# Patient Record
Sex: Female | Born: 1990 | Race: Black or African American | Hispanic: No | Smoking: Current every day smoker
Health system: Southern US, Community
[De-identification: ages and names within clinical notes are randomized; demographics above are authoritative.]

## PROBLEM LIST (undated history)

## (undated) DIAGNOSIS — J45909 Unspecified asthma, uncomplicated: Secondary | ICD-10-CM

## (undated) HISTORY — PX: CHOLECYSTECTOMY: SHX55

---

## 2020-03-01 ENCOUNTER — Emergency Department (HOSPITAL_COMMUNITY)
Admission: EM | Admit: 2020-03-01 | Discharge: 2020-03-02 | Disposition: A | Discharge status: Home / Self Care | Payer: Medicaid - Out of State | Attending: Emergency Medicine | Admitting: Emergency Medicine

## 2020-03-01 ENCOUNTER — Other Ambulatory Visit: Payer: Self-pay

## 2020-03-01 ENCOUNTER — Emergency Department (HOSPITAL_COMMUNITY): Payer: Medicaid - Out of State

## 2020-03-01 DIAGNOSIS — J069 Acute upper respiratory infection, unspecified: Secondary | ICD-10-CM | POA: Insufficient documentation

## 2020-03-01 DIAGNOSIS — J45909 Unspecified asthma, uncomplicated: Secondary | ICD-10-CM | POA: Insufficient documentation

## 2020-03-01 DIAGNOSIS — F1590 Other stimulant use, unspecified, uncomplicated: Secondary | ICD-10-CM | POA: Diagnosis not present

## 2020-03-01 DIAGNOSIS — Z20822 Contact with and (suspected) exposure to covid-19: Secondary | ICD-10-CM | POA: Insufficient documentation

## 2020-03-01 DIAGNOSIS — J988 Other specified respiratory disorders: Secondary | ICD-10-CM

## 2020-03-01 DIAGNOSIS — R0602 Shortness of breath: Secondary | ICD-10-CM | POA: Diagnosis present

## 2020-03-01 LAB — COMPREHENSIVE METABOLIC PANEL
ALT: 10 U/L (ref 0–44)
AST: 17 U/L (ref 15–41)
Albumin: 3.7 g/dL (ref 3.5–5.0)
Alkaline Phosphatase: 51 U/L (ref 38–126)
Anion gap: 10 (ref 5–15)
BUN: 8 mg/dL (ref 6–20)
CO2: 25 mmol/L (ref 22–32)
Calcium: 9.4 mg/dL (ref 8.9–10.3)
Chloride: 104 mmol/L (ref 98–111)
Creatinine, Ser: 0.98 mg/dL (ref 0.44–1.00)
GFR calc Af Amer: >60 mL/min (ref >60)
GFR calc non Af Amer: >60 mL/min (ref >60)
Glucose, Bld: 70 mg/dL (ref 70–99)
Potassium: 3.9 mmol/L (ref 3.5–5.1)
Sodium: 139 mmol/L (ref 135–145)
Total Bilirubin: 0.3 mg/dL (ref 0.3–1.2)
Total Protein: 7.5 g/dL (ref 6.5–8.1)

## 2020-03-01 LAB — CBC WITH DIFFERENTIAL/PLATELET
Abs Immature Granulocytes: 0.02 10*3/uL (ref 0.00–0.07)
Basophils Absolute: 0.1 10*3/uL (ref 0.0–0.1)
Basophils Relative: 1 %
Eosinophils Absolute: 0.2 10*3/uL (ref 0.0–0.5)
Eosinophils Relative: 3 %
HCT: 38.7 % (ref 36.0–46.0)
Hemoglobin: 11.6 g/dL — ABNORMAL LOW (ref 12.0–15.0)
Immature Granulocytes: 0 %
Lymphocytes Relative: 25 %
Lymphs Abs: 2 10*3/uL (ref 0.7–4.0)
MCH: 25.3 pg — ABNORMAL LOW (ref 26.0–34.0)
MCHC: 30 g/dL (ref 30.0–36.0)
MCV: 84.3 fL (ref 80.0–100.0)
Monocytes Absolute: 0.6 10*3/uL (ref 0.1–1.0)
Monocytes Relative: 7 %
Neutro Abs: 5.1 10*3/uL (ref 1.7–7.7)
Neutrophils Relative %: 64 %
Platelets: 393 10*3/uL (ref 150–400)
RBC: 4.59 MIL/uL (ref 3.87–5.11)
RDW: 14.4 % (ref 11.5–15.5)
WBC: 8 10*3/uL (ref 4.0–10.5)
nRBC: 0 % (ref 0.0–0.2)

## 2020-03-01 MED ORDER — SODIUM CHLORIDE 0.9% FLUSH
3.0000 mL | Freq: Once | INTRAVENOUS | Status: AC
  Administered 2020-03-02: 3 mL via INTRAVENOUS

## 2020-03-01 MED ORDER — ACETAMINOPHEN 325 MG PO TABS
650.0000 mg | ORAL_TABLET | Freq: Once | ORAL | Status: AC | PRN
  Administered 2020-03-01: 650 mg via ORAL
  Filled 2020-03-01: qty 2

## 2020-03-01 NOTE — ED Notes (Signed)
Pt sitting over by vending machines.

## 2020-03-01 NOTE — ED Triage Notes (Addendum)
Pt arrives pov with reports of chest tightness and shob onset yesterday. Reports hx of asthma and is in town visiting, does not have her inhaler with her. Pt in NAD. Speaking in complete sentences. Pt febrile in triage. States she was unaware of the fever.

## 2020-03-02 ENCOUNTER — Telehealth: Payer: Self-pay

## 2020-03-02 LAB — I-STAT BETA HCG BLOOD, ED (MC, WL, AP ONLY): I-stat hCG, quantitative: <5 m[IU]/mL (ref <5)

## 2020-03-02 LAB — SARS CORONAVIRUS 2 BY RT PCR (HOSPITAL ORDER, PERFORMED IN ~~LOC~~ HOSPITAL LAB): SARS Coronavirus 2: NEGATIVE

## 2020-03-02 LAB — LACTIC ACID, PLASMA: Lactic Acid, Venous: 1.6 mmol/L (ref 0.5–1.9)

## 2020-03-02 MED ORDER — BENZONATATE 100 MG PO CAPS
100.0000 mg | ORAL_CAPSULE | Freq: Three times a day (TID) | ORAL | 0 refills | Status: DC | PRN
Start: 2020-03-02 — End: 2022-12-16

## 2020-03-02 MED ORDER — DOXYCYCLINE HYCLATE 100 MG PO CAPS
100.0000 mg | ORAL_CAPSULE | Freq: Two times a day (BID) | ORAL | 0 refills | Status: DC
Start: 2020-03-02 — End: 2022-12-16

## 2020-03-02 MED ORDER — ALBUTEROL (5 MG/ML) CONTINUOUS INHALATION SOLN
10.0000 mg/h | INHALATION_SOLUTION | Freq: Once | RESPIRATORY_TRACT | Status: AC
  Administered 2020-03-02: 10 mg/h via RESPIRATORY_TRACT
  Filled 2020-03-02: qty 20

## 2020-03-02 MED ORDER — PREDNISONE 20 MG PO TABS
60.0000 mg | ORAL_TABLET | Freq: Once | ORAL | Status: AC
  Administered 2020-03-02: 60 mg via ORAL
  Filled 2020-03-02: qty 3

## 2020-03-02 MED ORDER — ALBUTEROL SULFATE HFA 108 (90 BASE) MCG/ACT IN AERS: 1.0000 | INHALATION_SPRAY | Freq: Four times a day (QID) | RESPIRATORY_TRACT | 0 refills | Status: DC | PRN

## 2020-03-02 MED ORDER — ALBUTEROL SULFATE HFA 108 (90 BASE) MCG/ACT IN AERS
8.0000 | INHALATION_SPRAY | Freq: Once | RESPIRATORY_TRACT | Status: AC
  Administered 2020-03-02: 8 via RESPIRATORY_TRACT
  Filled 2020-03-02: qty 6.7

## 2020-03-02 MED ORDER — IPRATROPIUM BROMIDE HFA 17 MCG/ACT IN AERS
4.0000 | INHALATION_SPRAY | Freq: Once | RESPIRATORY_TRACT | Status: AC
  Administered 2020-03-02: 4 via RESPIRATORY_TRACT
  Filled 2020-03-02: qty 12.9

## 2020-03-02 MED ORDER — AEROCHAMBER PLUS FLO-VU LARGE MISC: 1.0000 | Freq: Once | Status: DC

## 2020-03-02 MED ORDER — IBUPROFEN 400 MG PO TABS
400.0000 mg | ORAL_TABLET | Freq: Once | ORAL | Status: AC
  Administered 2020-03-02: 400 mg via ORAL
  Filled 2020-03-02: qty 1

## 2020-03-02 MED ORDER — PREDNISONE 10 MG PO TABS: 40.0000 mg | ORAL_TABLET | Freq: Every day | ORAL | 0 refills | Status: AC

## 2020-03-02 NOTE — Telephone Encounter (Signed)
Patient at pharmacy, her medications ordered escribe did not go through to [facility]. Called in to [facility] by this RNCM

## 2020-03-02 NOTE — ED Provider Notes (Signed)
[facility] EMERGENCY DEPARTMENT Provider Note   CSN: [id number] Arrival date & time: 03/01/20  1735     History Chief Complaint  Patient presents with   Shortness of Breath    Colleen Lewis is a 29 y.o. female with history of childhood asthma, marijuana use presents to the ED for evaluation of chest tightness and shortness of breath that began yesterday.  Associated with wheezing.  States while in the waiting room has developed runny nose, cough with clear phlegm, body aches, generalized malaise.  Fever 101 in triage. Patient flew from [place] to [place] 3 weeks ago.  Had first Pfizer vaccine 1 week ago.  Denies any sick contacts.  Has not needed to use inhalers for at least 2 years.  Denies sore throat, pleuritic chest pain, vomiting, diarrhea, abdominal pain, loss of taste or smell.  No history of blood clots, recent surgery, hormone therapy, prolonged immobilization, leg swelling, calf pain. No interventions. No modifying factors.   HPI     No past medical history on file.  There are no problems to display for this patient.   ** The histories are not reviewed yet. Please review them in the "History" navigator section and refresh this SmartLink.   OB History   No obstetric history on file.     No family history on file.  Social History   Tobacco Use   Smoking status: Not on file  Substance Use Topics   Alcohol use: Not on file   Drug use: Not on file    Home Medications Prior to Admission medications   Medication Sig Start Date End Date Taking? Authorizing Provider  albuterol (VENTOLIN HFA) 108 (90 Base) MCG/ACT inhaler Inhale 1-2 puffs into the lungs every 6 (six) hours as needed for wheezing or shortness of breath. 03/02/20   Liberty Handy, PA-C  benzonatate (TESSALON PERLES) 100 MG capsule Take 1 capsule (100 mg total) by mouth 3 (three) times daily as needed for cough. FOR DISRUPTIVE DAY TIME COUGH 03/02/20   Liberty Handy,  PA-C  doxycycline (VIBRAMYCIN) 100 MG capsule Take 1 capsule (100 mg total) by mouth 2 (two) times daily. 03/02/20   Liberty Handy, PA-C  predniSONE (DELTASONE) 10 MG tablet Take 4 tablets (40 mg total) by mouth daily for 5 days. 03/02/20 03/07/20  Liberty Handy, PA-C    Allergies    Benadryl [diphenhydramine]  Review of Systems   Review of Systems  Constitutional: Positive for chills, fatigue and fever.  HENT: Positive for congestion and rhinorrhea.   Respiratory: Positive for cough, chest tightness, shortness of breath and wheezing.   Musculoskeletal: Positive for myalgias.  All other systems reviewed and are negative.   Physical Exam Updated Vital Signs BP (!) 116/89    Pulse (!) 118    Temp (!) 101 F (38.3 C) (Oral)    Resp 23    Ht 5\' 5"  (1.651 m)    Wt (!) 95.3 kg    SpO2 99%    BMI 34.95 kg/m   Physical Exam Vitals and nursing note reviewed.  Constitutional:      General: She is not in acute distress.    Appearance: She is well-developed.     Comments: NAD.  HENT:     Head: Normocephalic and atraumatic.     Right Ear: External ear normal.     Left Ear: External ear normal.     Nose: Nose normal.  Eyes:     General: No scleral  icterus.    Conjunctiva/sclera: Conjunctivae normal.  Cardiovascular:     Rate and Rhythm: Normal rate and regular rhythm.     Heart sounds: Normal heart sounds. No murmur heard.      Comments: No LE edema. No calf tenderness.  Pulmonary:     Effort: Pulmonary effort is normal.     Breath sounds: Examination of the right-upper field reveals wheezing. Examination of the left-upper field reveals wheezing. Examination of the right-middle field reveals wheezing. Examination of the left-middle field reveals wheezing. Examination of the right-lower field reveals decreased breath sounds. Examination of the left-lower field reveals decreased breath sounds. Decreased breath sounds (difficult exam due to body habitus) and wheezing present.    Musculoskeletal:        General: No deformity. Normal range of motion.     Cervical back: Normal range of motion and neck supple.  Skin:    General: Skin is warm and dry.     Capillary Refill: Capillary refill takes less than 2 seconds.  Neurological:     Mental Status: She is alert and oriented to person, place, and time.  Psychiatric:        Behavior: Behavior normal.        Thought Content: Thought content normal.        Judgment: Judgment normal.     ED Results / Procedures / Treatments   Labs (all labs ordered are listed, but only abnormal results are displayed) Labs Reviewed  CBC WITH DIFFERENTIAL/PLATELET - Abnormal; Notable for the following components:      Result Value   Hemoglobin 11.6 (*)    MCH 25.3 (*)    All other components within normal limits  SARS CORONAVIRUS 2 BY RT PCR (HOSPITAL ORDER, PERFORMED IN [facility])  COMPREHENSIVE METABOLIC PANEL  LACTIC ACID, PLASMA  I-STAT BETA HCG BLOOD, ED ([facility], [facility], [facility] ONLY)  I-STAT BETA HCG BLOOD, ED ([facility], [facility], [facility] ONLY)    EKG EKG Interpretation  Date/Time:  Friday March 01 2020 17:48:33 EDT Ventricular Rate:  99 PR Interval:  148 QRS Duration: 76 QT Interval:  334 QTC Calculation: 428 R Axis:   59 Text Interpretation: Normal sinus rhythm Nonspecific ST abnormality Abnormal ECG No previous ECGs available Confirmed by Zadie Rhine ([id number]) on 03/02/2020 3:22:11 AM   Radiology DG Chest 2 View  Result Date: 03/01/2020 CLINICAL DATA:  Shortness of breath EXAM: CHEST - 2 VIEW COMPARISON:  None FINDINGS: No consolidation, features of edema, pneumothorax, or effusion. Pulmonary vascularity is normally distributed. The cardiomediastinal contours are unremarkable. No acute osseous or soft tissue abnormality. Cholecystectomy clips in the right upper quadrant. IMPRESSION: No acute cardiopulmonary abnormality. Electronically Signed   By: Kreg Shropshire M.D.   On: 03/01/2020 18:20    Procedures Procedures  (including critical care time)  Medications Ordered in ED Medications  AeroChamber Plus Flo-Vu Large MISC 1 each (1 each Other Not Given 03/02/20 0351)  sodium chloride flush (NS) 0.9 % injection 3 mL (3 mLs Intravenous Given 03/02/20 0211)  acetaminophen (TYLENOL) tablet 650 mg (650 mg Oral Given 03/01/20 1758)  albuterol (VENTOLIN HFA) 108 (90 Base) MCG/ACT inhaler 8 puff (8 puffs Inhalation Given 03/02/20 0229)  ipratropium (ATROVENT HFA) inhaler 4 puff (4 puffs Inhalation Given 03/02/20 0229)  predniSONE (DELTASONE) tablet 60 mg (60 mg Oral Given 03/02/20 0229)  ibuprofen (ADVIL) tablet 400 mg (400 mg Oral Given 03/02/20 0228)  albuterol (PROVENTIL,VENTOLIN) solution continuous neb (10 mg/hr Nebulization Given 03/02/20 0357)  ED Course  I have reviewed the triage vital signs and the nursing notes.  Pertinent labs & imaging results that were available during my care of the patient were reviewed by me and considered in my medical decision making (see chart for details).  Clinical Course as of Mar 02 428  Sat Mar 02, 2020  0206 Pulse Rate(!): 110 [CG]  0206 Temp(!): 101 F (38.3 C) [CG]  0343 Discussed work up findings with patient including negative covid test. States she feels better but still coughing. Will do longer breathing treatment, reassess. Anticipate discharge with antibiotics, prednisone, albuterol.    [CG]    Clinical Course User Index [CG] Liberty Handy, PA-C   MDM Rules/Calculators/A&P                          This patient complaints of URI symptoms. Found to be febrile here.  Had first dose of Pfizer vaccine one week ago. Flew from [place] 3 weeks ago. No sick contacts.  History of asthma, marijuana and tobacco use.   Ddx includes viral URI, CAP.  COVID illness considered but less likely given first vaccine 1 week ago, no exposure, no recent travel.  Labs ordered in triage personally reviewed and interpreted. Available medical records, triage and nursing notes  reviewed to assist with MDM and obtaining more history.   Patient febrile and tachycardic however overall well appearing.  Normal BP, no tachypnea. No significant increased WOB, hypoxia. Ambulated here and reported dyspnea but maintained normal oxygenation.  Technically meets SIRS criteria but clinical well appearing, non toxic.  Source likely respiratory.  However, no leukocytosis, normal  BP. Lactic acid normal. CXR without acute findings.  COVID negative.    Highest on ddx is viral URI other than COVID.  Possibly radiographic delayed CAP.    Ordered prednisone, motrin, atrovent, albuterol. Will reassess.   Patient re-evaluated and feels better. Wheezing improved. Vitals stable. Some tachycardia likely from long breathing albuterol treatment.  Feels better.   Given history of tobacco use, will discharge with doxycycline, prednisone, albuterol inhaler, tessalon perles.  Discussed plan with patient who is in agreement with plan. Return precautions given.  Final Clinical Impression(s) / ED Diagnoses Final diagnoses:  Respiratory infection    Rx / DC Orders ED Discharge Orders         Ordered    doxycycline (VIBRAMYCIN) 100 MG capsule  2 times daily     Discontinue  Reprint     03/02/20 0429    albuterol (VENTOLIN HFA) 108 (90 Base) MCG/ACT inhaler  Every 6 hours PRN     Discontinue  Reprint     03/02/20 0429    predniSONE (DELTASONE) 10 MG tablet  Daily     Discontinue  Reprint     03/02/20 0429    benzonatate (TESSALON PERLES) 100 MG capsule  3 times daily PRN     Discontinue  Reprint     03/02/20 0429           Liberty Handy, PA-C 03/02/20 0430    Zadie Rhine, MD 03/02/20 [phone]

## 2020-03-02 NOTE — ED Notes (Signed)
Pt ambulated for 3 minutes within room, sats at 93%-96%, witnessed wheezing sounds and pt stated she felt very winded

## 2020-03-02 NOTE — Discharge Instructions (Signed)
You were seen in the ER for upper respiratory symptoms, fever  COVID test is negative.  Please plan on obtaining your second vaccine as scheduled.  We discussed possibility of pneumonia despite normal chest x-ray.   We will treat your symptoms with antibiotic (doxycycline), prednisone and breathing treatment.   Symptoms and fever should improve in 48 hours after antibiotics.   Return immediately for worsening or new symptoms

## 2021-01-14 ENCOUNTER — Encounter (HOSPITAL_COMMUNITY): Payer: Self-pay | Admitting: Emergency Medicine

## 2021-01-14 ENCOUNTER — Ambulatory Visit (HOSPITAL_COMMUNITY)
Admission: EM | Admit: 2021-01-14 | Discharge: 2021-01-14 | Disposition: A | Discharge status: Home / Self Care | Payer: Managed Care, Other (non HMO) | Attending: Urgent Care | Admitting: Urgent Care

## 2021-01-14 ENCOUNTER — Other Ambulatory Visit: Payer: Self-pay

## 2021-01-14 DIAGNOSIS — L259 Unspecified contact dermatitis, unspecified cause: Secondary | ICD-10-CM

## 2021-01-14 MED ORDER — TRIAMCINOLONE ACETONIDE 0.1 % EX CREA
1.0000 | TOPICAL_CREAM | Freq: Two times a day (BID) | CUTANEOUS | 0 refills | Status: DC
Start: 2021-01-14 — End: 2024-01-23

## 2021-01-14 NOTE — ED Provider Notes (Signed)
  [facility]   MRN: [id number] DOB: 1991/04/01  Subjective:   Colleen Lewis is a 30 y.o. female presenting for evaluation following a tick bite. Had this to the back of her left knee. Her friend removed the tick yesterday. Denies fever, drainage of pus or bleeding, redness, headache, eye pain, sore throat, cough, joint pains, body aches.  Has not used any medications for relief.  Denies taking chronic medications.    Allergies  Allergen Reactions  . Benadryl [Diphenhydramine] Hives    History reviewed. No pertinent past medical history.   Past Surgical History:  Procedure Laterality Date  . CHOLECYSTECTOMY      Family History  Problem Relation Age of Onset  . Diabetes Mother   . Diabetes Father     Social History   Tobacco Use  . Smoking status: Never Smoker  . Smokeless tobacco: Never Used  Vaping Use  . Vaping Use: Every day  Substance Use Topics  . Alcohol use: Yes  . Drug use: Yes    Types: Marijuana    ROS   Objective:   Vitals: BP (!) 135/98 (BP Location: Right Arm) Comment (BP Location): repositioned patient  Pulse 75   Temp 98.7 F (37.1 C) (Oral)   Resp 18   LMP 12/28/2020   SpO2 100%   Physical Exam Constitutional:      General: She is not in acute distress.    Appearance: Normal appearance. She is well-developed. She is not ill-appearing, toxic-appearing or diaphoretic.  HENT:     Head: Normocephalic and atraumatic.     Nose: Nose normal.     Mouth/Throat:     Mouth: Mucous membranes are moist.     Pharynx: Oropharynx is clear.  Eyes:     General: No scleral icterus.       Right eye: No discharge.        Left eye: No discharge.     Extraocular Movements: Extraocular movements intact.     Conjunctiva/sclera: Conjunctivae normal.     Pupils: Pupils are equal, round, and reactive to light.  Cardiovascular:     Rate and Rhythm: Normal rate.  Pulmonary:     Effort: Pulmonary effort is normal.  Skin:    General:  Skin is warm and dry.       Neurological:     General: No focal deficit present.     Mental Status: She is alert and oriented to person, place, and time.  Psychiatric:        Mood and Affect: Mood normal.        Behavior: Behavior normal.        Thought Content: Thought content normal.        Judgment: Judgment normal.      Assessment and Plan :   PDMP not reviewed this encounter.  1. Contact dermatitis, unspecified contact dermatitis type, unspecified trigger     Recommended conservative management with Kenalog as needed for up to 1 week. No signs of tick borne illness otherwise. Counseled patient on potential for adverse effects with medications prescribed/recommended today, ER and return-to-clinic precautions discussed, patient verbalized understanding.    Wallis Bamberg, PA-C 01/14/21 1436

## 2021-01-14 NOTE — ED Triage Notes (Signed)
Tick removed last night.  Tick located on the back of right knee

## 2021-02-20 ENCOUNTER — Other Ambulatory Visit: Payer: Self-pay

## 2021-02-20 ENCOUNTER — Encounter (HOSPITAL_COMMUNITY): Payer: Self-pay | Admitting: Emergency Medicine

## 2021-02-20 ENCOUNTER — Ambulatory Visit (HOSPITAL_COMMUNITY)
Admission: EM | Admit: 2021-02-20 | Discharge: 2021-02-20 | Disposition: A | Discharge status: Home / Self Care | Payer: Self-pay | Attending: Internal Medicine | Admitting: Internal Medicine

## 2021-02-20 DIAGNOSIS — Z20822 Contact with and (suspected) exposure to covid-19: Secondary | ICD-10-CM | POA: Insufficient documentation

## 2021-02-20 NOTE — ED Triage Notes (Signed)
Covid exposure, testing requested

## 2021-02-20 NOTE — Discharge Instructions (Signed)

## 2021-02-21 LAB — SARS CORONAVIRUS 2 (TAT 6-24 HRS): SARS Coronavirus 2: NEGATIVE

## 2021-12-11 ENCOUNTER — Ambulatory Visit (HOSPITAL_COMMUNITY)
Admission: EM | Admit: 2021-12-11 | Discharge: 2021-12-11 | Disposition: A | Discharge status: Home / Self Care | Payer: Self-pay | Attending: Internal Medicine | Admitting: Internal Medicine

## 2021-12-11 ENCOUNTER — Encounter (HOSPITAL_COMMUNITY): Payer: Self-pay

## 2021-12-11 DIAGNOSIS — J301 Allergic rhinitis due to pollen: Secondary | ICD-10-CM

## 2021-12-11 DIAGNOSIS — Z760 Encounter for issue of repeat prescription: Secondary | ICD-10-CM

## 2021-12-11 DIAGNOSIS — J4520 Mild intermittent asthma, uncomplicated: Secondary | ICD-10-CM

## 2021-12-11 MED ORDER — MONTELUKAST SODIUM 10 MG PO TABS: 10.0000 mg | ORAL_TABLET | Freq: Every day | ORAL | 2 refills | Status: DC

## 2021-12-11 MED ORDER — MONTELUKAST SODIUM 10 MG PO TABS: 10.0000 mg | ORAL_TABLET | Freq: Every day | ORAL | 1 refills | Status: DC

## 2021-12-11 MED ORDER — ALBUTEROL SULFATE HFA 108 (90 BASE) MCG/ACT IN AERS: 1.0000 | INHALATION_SPRAY | Freq: Four times a day (QID) | RESPIRATORY_TRACT | 1 refills | Status: DC | PRN

## 2021-12-11 MED ORDER — FLUTICASONE PROPIONATE 50 MCG/ACT NA SUSP: 1.0000 | Freq: Every day | NASAL | 2 refills | Status: DC

## 2021-12-11 NOTE — ED Provider Notes (Signed)
?[facility] ? ? ? ?CSN: [id number] ?Arrival date & time: 12/11/21  1414 ? ? ?  ? ?History   ?Chief Complaint ?Chief Complaint  ?Patient presents with  ? Medication Refill  ? ? ?HPI ?Colleen Lewis is a 31 y.o. female.  ? ?Patient presents to urgent care for refill of her rescue inhaler. She has been using albuterol every other day during the winter months for asthma flare ups. She does not have a primary care provider. She has had to use her albuterol inhaler a lot over the winter and she ran out of the medication. She states that this happens every winter due to the "cold air" and states that her breathing gets worse with activity and when she is around dogs and cats.  She was last seen for her asthma 2 years ago in the summer where she was prescribed the albuterol inhaler. For the last month, she has been using her albuterol rescue inhaler every other day.  She has a history of hospitalization for an asthma exacerbation 4 years ago.  She does not take any daily medication for allergies at this time.  She denies fever, nausea, vomiting, dizziness, fatigue, shortness of breath, chest pain, and wheezing at this time.  She does report slight nasal drainage, but attributes this to her allergies.  Denies any other aggravating or relieving factors for symptoms at this time. ? ? ?Medication Refill ? ?History reviewed. No pertinent past medical history. ? ?There are no problems to display for this patient. ? ? ?Past Surgical History:  ?Procedure Laterality Date  ? CHOLECYSTECTOMY    ? ? ?OB History   ?No obstetric history on file. ?  ? ? ? ?Home Medications   ? ?Prior to Admission medications   ?Medication Sig Start Date End Date Taking? Authorizing Provider  ?albuterol (VENTOLIN HFA) 108 (90 Base) MCG/ACT inhaler Inhale 1-2 puffs into the lungs every 6 (six) hours as needed for wheezing or shortness of breath. 12/11/21  Yes Talbot Grumbling, FNP  ?fluticasone (FLONASE) 50 MCG/ACT nasal spray Place 1 spray  into both nostrils daily. 12/11/21  Yes Talbot Grumbling, FNP  ?benzonatate (TESSALON PERLES) 100 MG capsule Take 1 capsule (100 mg total) by mouth 3 (three) times daily as needed for cough. FOR DISRUPTIVE DAY TIME COUGH ?Patient not taking: Reported on 01/14/2021 03/02/20   Kinnie Feil, PA-C  ?doxycycline (VIBRAMYCIN) 100 MG capsule Take 1 capsule (100 mg total) by mouth 2 (two) times daily. ?Patient not taking: Reported on 01/14/2021 03/02/20   Kinnie Feil, PA-C  ?montelukast (SINGULAIR) 10 MG tablet Take 1 tablet (10 mg total) by mouth at bedtime. 12/11/21   Talbot Grumbling, FNP  ?triamcinolone cream (KENALOG) 0.1 % Apply 1 application topically 2 (two) times daily. 01/14/21   Jaynee Eagles, PA-C  ? ? ?Family History ?Family History  ?Problem Relation Age of Onset  ? Diabetes Mother   ? Diabetes Father   ? ? ?Social History ?Social History  ? ?Tobacco Use  ? Smoking status: Never  ? Smokeless tobacco: Never  ?Vaping Use  ? Vaping Use: Every day  ?Substance Use Topics  ? Alcohol use: Yes  ? Drug use: Yes  ?  Types: Marijuana  ? ? ? ?Allergies   ?Benadryl [diphenhydramine] ? ? ?Review of Systems ?Review of Systems ?Per HPI ? ?Physical Exam ?Triage Vital Signs ?ED Triage Vitals  ?Enc Vitals Group  ?   BP 12/11/21 1449 (!) 144/107  ?  Pulse Rate 12/11/21 1449 73  ?   Resp 12/11/21 1449 18  ?   Temp 12/11/21 1449 98.1 ?F (36.7 ?C)  ?   Temp Source 12/11/21 1449 Oral  ?   SpO2 12/11/21 1449 98 %  ?   Weight --   ?   Height --   ?   Head Circumference --   ?   Peak Flow --   ?   Pain Score 12/11/21 1450 0  ?   Pain Loc --   ?   Pain Edu? --   ?   Excl. in [place]? --   ? ?No data found. ? ?Updated Vital Signs ?BP (!) 144/107 (BP Location: Left Arm)   Pulse 73   Temp 98.1 ?F (36.7 ?C) (Oral)   Resp 18   LMP 12/01/2021 (Approximate)   SpO2 98%  ? ?Visual Acuity ?Right Eye Distance:   ?Left Eye Distance:   ?Bilateral Distance:   ? ?Right Eye Near:   ?Left Eye Near:    ?Bilateral Near:    ? ?Physical  Exam ?Vitals and nursing note reviewed.  ?Constitutional:   ?   General: She is not in acute distress. ?   Appearance: Normal appearance. She is well-developed. She is not ill-appearing.  ?HENT:  ?   Head: Normocephalic and atraumatic.  ?   Right Ear: Tympanic membrane, ear canal and external ear normal.  ?   Left Ear: Tympanic membrane, ear canal and external ear normal.  ?   Nose: Congestion and rhinorrhea present.  ?   Right Turbinates: Swollen and pale.  ?   Left Turbinates: Swollen and pale.  ?   Mouth/Throat:  ?   Lips: Pink.  ?   Mouth: Mucous membranes are moist. No oral lesions or angioedema.  ?   Dentition: Normal dentition.  ?   Tongue: No lesions. Tongue does not deviate from midline.  ?   Palate: No mass and lesions.  ?   Pharynx: Oropharynx is clear. Uvula midline. Posterior oropharyngeal erythema present. No oropharyngeal exudate.  ?   Tonsils: No tonsillar exudate or tonsillar abscesses. 2+ on the right. 2+ on the left.  ?   Comments: Mild erythema and clear postnasal drainage to posterior oropharynx.  +2 tonsillar swelling present bilaterally to physical exam.  Airway is patent.  No tonsillar abscesses or exudate appreciated.  ?Eyes:  ?   Extraocular Movements: Extraocular movements intact.  ?   Conjunctiva/sclera: Conjunctivae normal.  ?Cardiovascular:  ?   Rate and Rhythm: Normal rate and regular rhythm.  ?   Heart sounds: Normal heart sounds. No murmur heard. ?  No friction rub. No gallop.  ?Pulmonary:  ?   Effort: Pulmonary effort is normal. No respiratory distress.  ?   Breath sounds: Normal breath sounds and air entry. No decreased breath sounds, wheezing, rhonchi or rales.  ?   Comments: Patient is sitting comfortably on exam table in no acute distress.  No adventitious lung sounds heard to all lung fields with auscultation.  ?Chest:  ?   Chest wall: No tenderness.  ?Abdominal:  ?   Palpations: Abdomen is soft.  ?   Tenderness: There is no abdominal tenderness. There is no right CVA  tenderness or left CVA tenderness.  ?Musculoskeletal:     ?   General: No swelling.  ?   Cervical back: Neck supple.  ?Skin: ?   General: Skin is warm and dry.  ?   Capillary Refill: Capillary refill  takes less than 2 seconds.  ?   Findings: Erythema present. No rash.  ?Neurological:  ?   General: No focal deficit present.  ?   Mental Status: She is alert and oriented to person, place, and time.  ?   Motor: No weakness.  ?   Gait: Gait normal.  ?Psychiatric:     ?   Mood and Affect: Mood normal.     ?   Behavior: Behavior normal.     ?   Thought Content: Thought content normal.     ?   Judgment: Judgment normal.  ? ? ? ?Tonsilar swelling to the right ?Nasal passages inflamed ?Benign lung exam ? ?UC Treatments / Results  ?Labs ?(all labs ordered are listed, but only abnormal results are displayed) ?Labs Reviewed - No data to display ? ?EKG ? ? ?Radiology ?No results found. ? ?Procedures ?Procedures (including critical care time) ? ?Medications Ordered in UC ?Medications - No data to display ? ?Initial Impression / Assessment and Plan / UC Course  ?I have reviewed the triage vital signs and the nursing notes. ? ?Pertinent labs & imaging results that were available during my care of the patient were reviewed by me and considered in my medical decision making (see chart for details). ? ?Patient is a 31 year old female presenting to urgent care for an albuterol refill.  She has been using her albuterol rescue inhaler every other day for the last month and her symptoms are worsened by exposure to her girlfriends dogs and cat.  Doubt exercise-induced asthma cause at this time due to the timing of her symptoms in relation to pet dander and exposure to the outdoors.  Her asthma was generally well controlled prior to 1 month ago.  She also has significant allergic rhinitis symptoms of nasal congestion/drainage and slight cough.  She does not currently take any medications for her allergic rhinitis and does not have a primary  care provider at this time.  ? ?Cardiopulmonary exam today stable.  No adventitious lung sounds heard to auscultation.  Deferred imaging at this time due to stable cardiopulmonary exam and vital signs.  She is not in an acute as

## 2021-12-11 NOTE — Discharge Instructions (Addendum)
You were seen today for your asthma and seasonal allergies. ?It is very important for you to establish care with a primary care provider to manage these illnesses.  I have set you up with PCP assistance today so someone from [facility] will be calling you regarding establishing a primary care appointment.  You may also go online to [email or web address] and follow the prompts to select an appointment yourself. ? ?I have prescribed your rescue inhaler with 1 refill at the pharmacy.  Please return to urgent care if you find yourself having to use his rescue inhaler very frequently or if you develop any new or worsening symptoms prior to establishing care primary care. ? ?Please take montelukast daily for allergies and asthma.  This should help to decrease your asthma symptoms and help you breathe better during allergy season. ? ?Please also take Flonase nasal spray 1 spray into each nostril daily.  This will help with nasal swelling and nasal drainage that is causing some throat pain for you due to postnasal drip. ? ?Please discuss your high blood pressure, tonsil swelling, and asthma with your primary care provider. ? ?If you develop any new or worsening symptoms or do not improve in the next 2 to 3 days, please return.  If your symptoms are severe, please go to the emergency room.  Follow-up with your primary care provider for further evaluation and management of your symptoms as well as ongoing wellness visits.  I hope you feel better! ? ?

## 2021-12-11 NOTE — ED Triage Notes (Signed)
Pt here for refill for albuterol inhaler ?

## 2022-01-06 IMAGING — CR DG CHEST 2V
2 series · 2 of 2 positions shown · non-contrast
Comparison: None

CLINICAL DATA: Shortness of breath

EXAM:
CHEST - 2 VIEW

[chest pa]
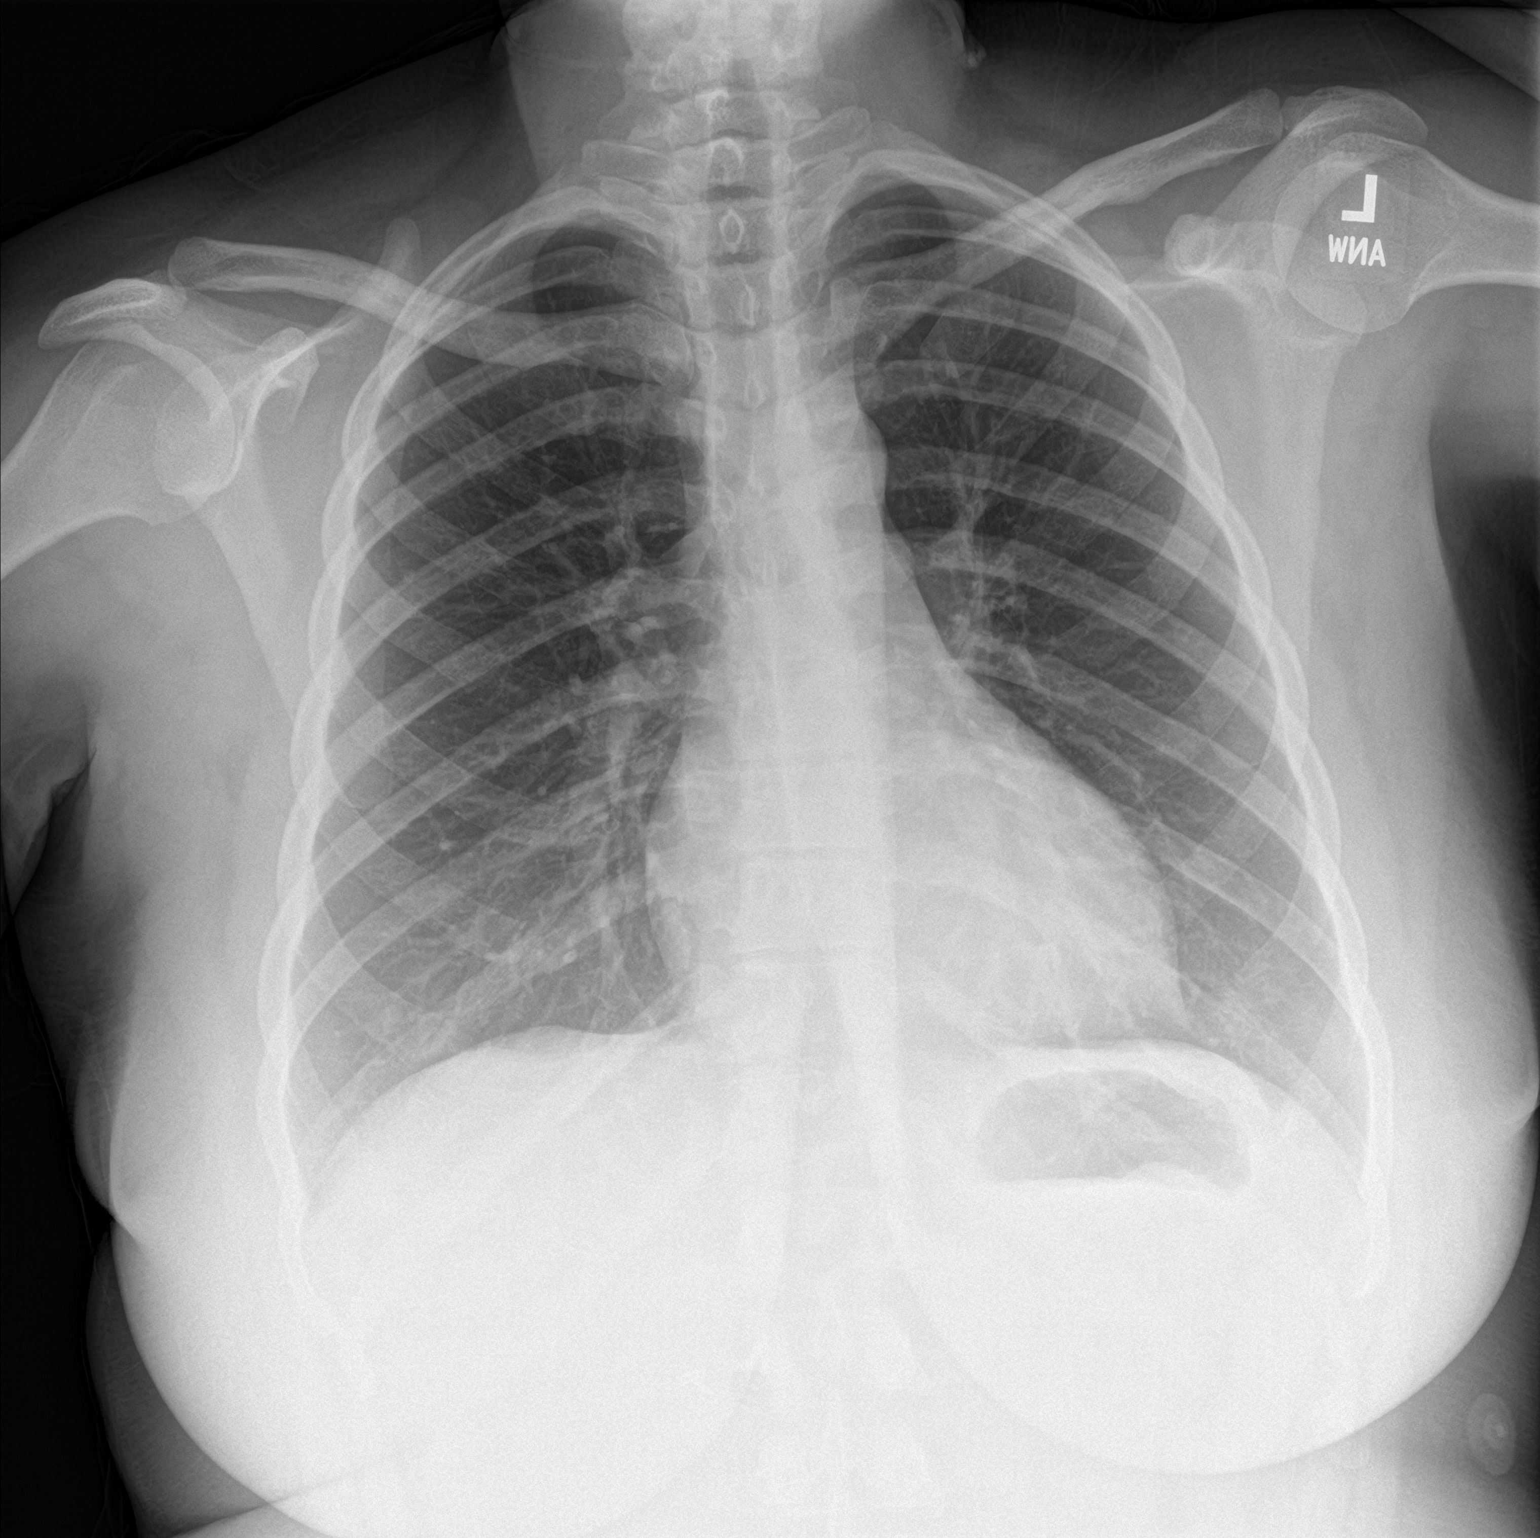

[chest lat]
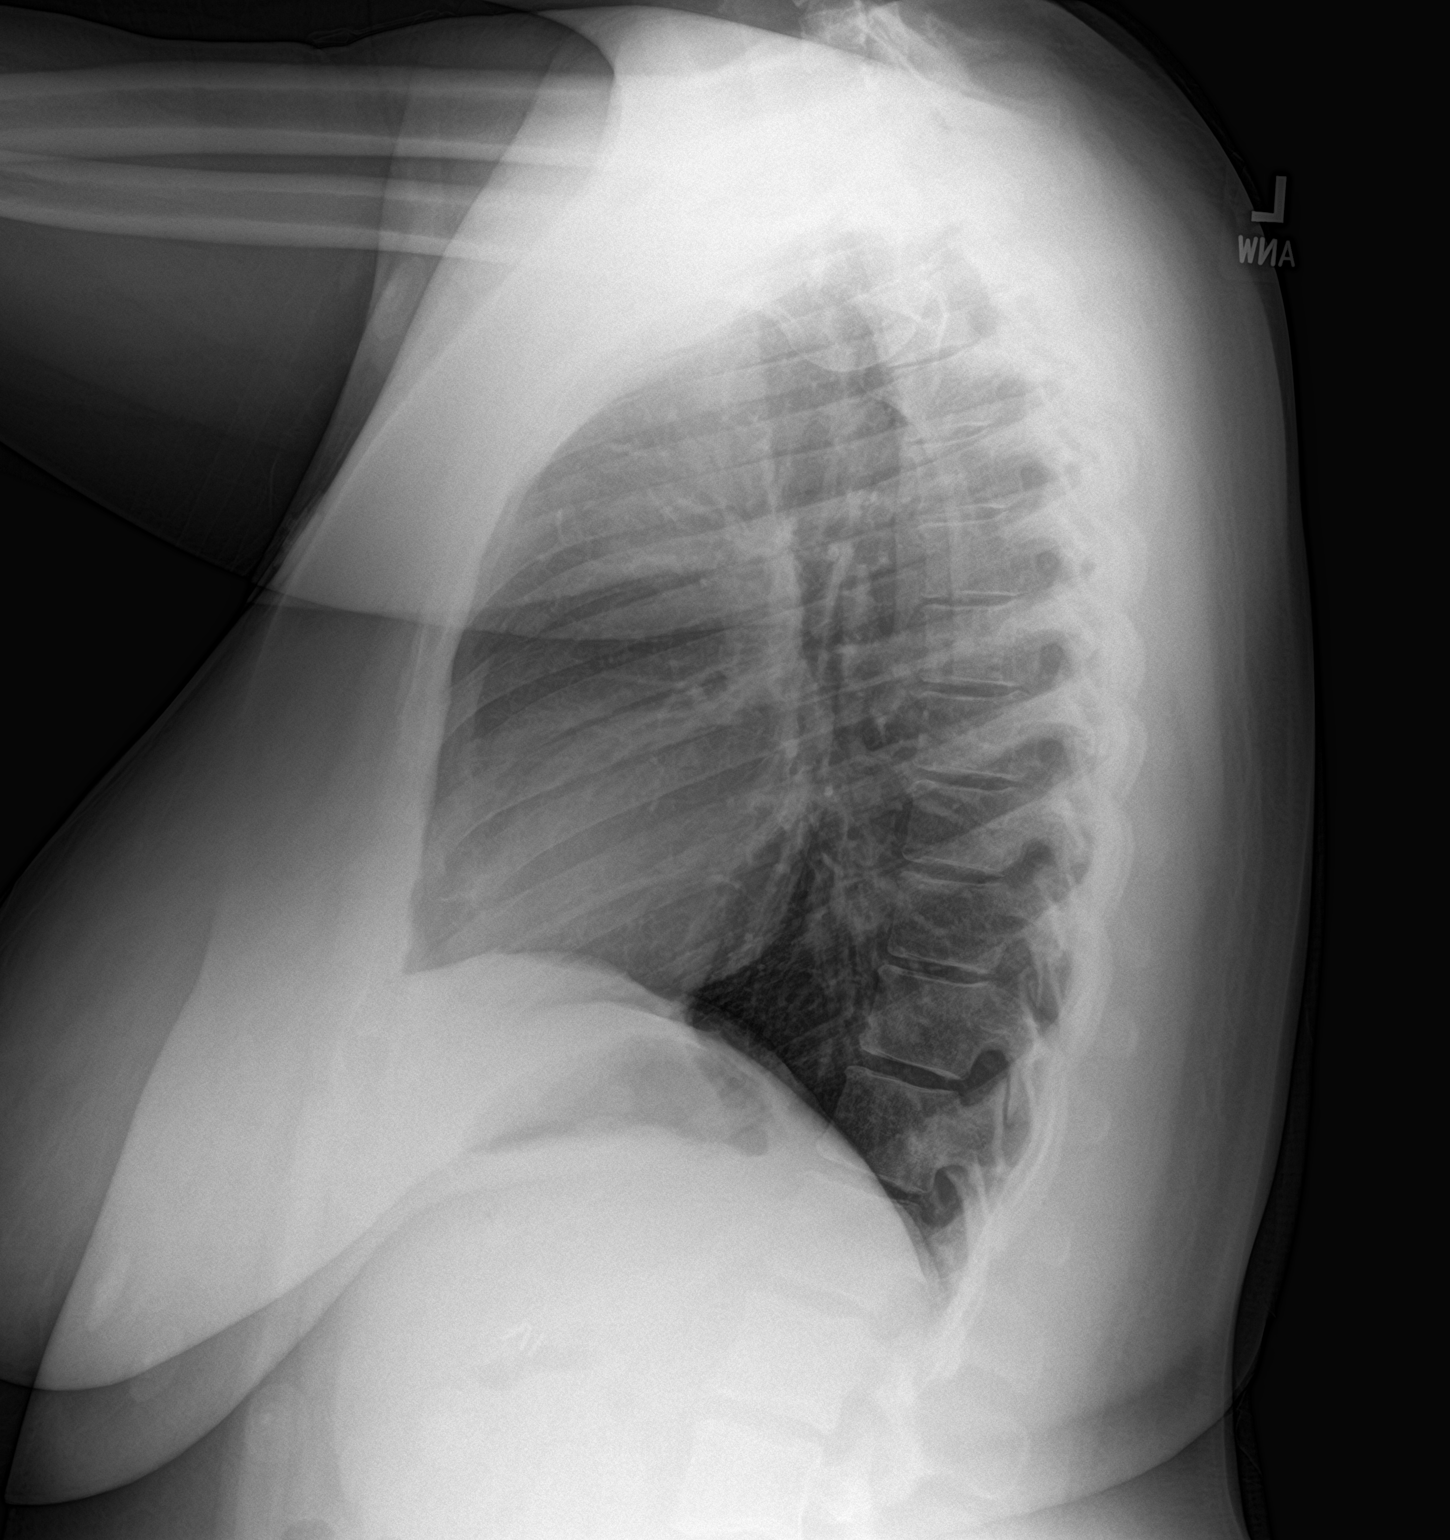

[2 of 2 positions shown; findings below may reference images not displayed]

FINDINGS: No consolidation, features of edema, pneumothorax, or effusion.
Pulmonary vascularity is normally distributed. The cardiomediastinal
contours are unremarkable. No acute osseous or soft tissue
abnormality. Cholecystectomy clips in the right upper quadrant.
IMPRESSION: No acute cardiopulmonary abnormality.

## 2022-03-12 ENCOUNTER — Ambulatory Visit (HOSPITAL_COMMUNITY)
Admission: EM | Admit: 2022-03-12 | Discharge: 2022-03-12 | Disposition: A | Discharge status: Home / Self Care | Payer: Self-pay | Attending: Family Medicine | Admitting: Family Medicine

## 2022-03-12 ENCOUNTER — Encounter (HOSPITAL_COMMUNITY): Payer: Self-pay

## 2022-03-12 DIAGNOSIS — N926 Irregular menstruation, unspecified: Secondary | ICD-10-CM

## 2022-03-12 DIAGNOSIS — M533 Sacrococcygeal disorders, not elsewhere classified: Secondary | ICD-10-CM

## 2022-03-12 LAB — POCT URINALYSIS DIPSTICK, ED / UC
Bilirubin Urine: NEGATIVE
Glucose, UA: NEGATIVE mg/dL
Ketones, ur: NEGATIVE mg/dL
Leukocytes,Ua: NEGATIVE
Nitrite: NEGATIVE
Protein, ur: NEGATIVE mg/dL
Specific Gravity, Urine: 1.02 (ref 1.005–1.030)
Urobilinogen, UA: 0.2 mg/dL (ref 0.0–1.0)
pH: 5.5 (ref 5.0–8.0)

## 2022-03-12 LAB — POC URINE PREG, ED: Preg Test, Ur: NEGATIVE

## 2022-03-12 MED ORDER — PREDNISONE 20 MG PO TABS: 40.0000 mg | ORAL_TABLET | Freq: Every day | ORAL | 0 refills | Status: DC

## 2022-03-12 NOTE — ED Provider Notes (Signed)
[facility]   [id number] 03/12/22 Arrival Time: 1241  ASSESSMENT & PLAN:  1. Coccydynia   2. Missed menses    UPT negative. On menstrual period now. Was late.  Able to ambulate here and hemodynamically stable. No indication for imaging of back at this time given no trauma and normal neurological exam. Discussed. Trial of: Meds ordered this encounter  Medications   predniSONE (DELTASONE) 20 MG tablet    Sig: Take 2 tablets (40 mg total) by mouth daily.    Dispense:  10 tablet    Refill:  0   Ibup as needed. Encourage ROM/movement as tolerated.  Recommend:  Follow-up Information     [facility].   Why: If worsening or failing to improve as anticipated. Contact information: [address] [place] [address] [phone]                Reviewed expectations re: course of current medical issues. Questions answered. Outlined signs and symptoms indicating need for more acute intervention. Patient verbalized understanding. After Visit Summary given.   SUBJECTIVE: History from: patient.  Colleen Lewis is a 31 y.o. female who presents with complaint of "tailbone pain"; x 2 weeks. No injury/trauma. Ambulatory. Normal bowel/bladder habits. No extremity sensation changes or weakness. Ibup with some relief.  Also missed menses last month. Just started period. Would like UPT.    OBJECTIVE:  Vitals:   03/12/22 1409 03/12/22 1412  BP:  (!) 140/104  Pulse: 75   Resp: 16 16  Temp: 98.4 F (36.9 C)   TempSrc: Oral   SpO2: 98% 98%    General appearance: alert; no distress HEENT: [place]; AT Neck: supple with FROM; without midline tenderness CV: regular Lungs: unlabored respirations; speaks full sentences without difficulty Abdomen: soft, non-tender; non-distended Back: mild  and poorly localized tenderness to palpation over coccyx ; FROM at waist; bruising: none; without midline tenderness Extremities:  without edema; symmetrical without gross deformities; normal ROM of bilateral LE Skin: warm and dry Neurologic: normal gait; normal sensation and strength of bilateral LE Psychological: alert and cooperative; normal mood and affect  Labs: Results for orders placed or performed during the hospital encounter of 03/12/22  POC urine pregnancy  Result Value Ref Range   Preg Test, Ur NEGATIVE NEGATIVE  POC Urinalysis dipstick  Result Value Ref Range   Glucose, UA NEGATIVE NEGATIVE mg/dL   Bilirubin Urine NEGATIVE NEGATIVE   Ketones, ur NEGATIVE NEGATIVE mg/dL   Specific Gravity, Urine 1.020 1.005 - 1.030   Hgb urine dipstick MODERATE (A) NEGATIVE   pH 5.5 5.0 - 8.0   Protein, ur NEGATIVE NEGATIVE mg/dL   Urobilinogen, UA 0.2 0.0 - 1.0 mg/dL   Nitrite NEGATIVE NEGATIVE   Leukocytes,Ua NEGATIVE NEGATIVE   Labs Reviewed  POCT URINALYSIS DIPSTICK, ED / UC - Abnormal; Notable for the following components:      Result Value   Hgb urine dipstick MODERATE (*)    All other components within normal limits  POC URINE PREG, ED    Imaging: No results found.  Allergies  Allergen Reactions   Benadryl [Diphenhydramine] Hives    History reviewed. No pertinent past medical history. Social History   Socioeconomic History   Marital status: Significant Other    Spouse name: Not on file   Number of children: Not on file   Years of education: Not on file   Highest education level: Not on file  Occupational History   Not on  file  Tobacco Use   Smoking status: Never   Smokeless tobacco: Never  Vaping Use   Vaping Use: Every day  Substance and Sexual Activity   Alcohol use: Yes   Drug use: Yes    Types: Marijuana   Sexual activity: Not on file  Other Topics Concern   Not on file  Social History Narrative   Not on file   Social Determinants of Health   Financial Resource Strain: Not on file  Food Insecurity: Not on file  Transportation Needs: Not on file  Physical Activity: Not  on file  Stress: Not on file  Social Connections: Not on file  Intimate Partner Violence: Not on file   Family History  Problem Relation Age of Onset   Diabetes Mother    Diabetes Father    Past Surgical History:  Procedure Laterality Date   Barrington Ellison, MD 03/12/22 1527

## 2022-03-12 NOTE — ED Triage Notes (Signed)
Onset 2-3 days ago, pt reports tailbone pain. Pt reports no falls or injuries. Pt would like a UPT, she is concern she is pregnant.

## 2022-10-05 ENCOUNTER — Ambulatory Visit (HOSPITAL_COMMUNITY): Payer: Self-pay

## 2022-10-06 ENCOUNTER — Encounter (HOSPITAL_COMMUNITY): Payer: Self-pay

## 2022-10-06 ENCOUNTER — Ambulatory Visit (HOSPITAL_COMMUNITY)
Admission: RE | Admit: 2022-10-06 | Discharge: 2022-10-06 | Disposition: A | Discharge status: Home / Self Care | Payer: Medicaid - Out of State | Source: Ambulatory Visit | Attending: Internal Medicine | Admitting: Internal Medicine

## 2022-10-06 VITALS — BP 150/100 | HR 82 | Temp 99.6°F | Resp 18

## 2022-10-06 DIAGNOSIS — N898 Other specified noninflammatory disorders of vagina: Secondary | ICD-10-CM | POA: Insufficient documentation

## 2022-10-06 DIAGNOSIS — Z202 Contact with and (suspected) exposure to infections with a predominantly sexual mode of transmission: Secondary | ICD-10-CM | POA: Insufficient documentation

## 2022-10-06 HISTORY — DX: Unspecified asthma, uncomplicated: J45.909

## 2022-10-06 LAB — HIV ANTIBODY (ROUTINE TESTING W REFLEX): HIV Screen 4th Generation wRfx: NONREACTIVE

## 2022-10-06 MED ORDER — CEFTRIAXONE SODIUM 500 MG IJ SOLR
500.0000 mg | INTRAMUSCULAR | Status: DC
  Administered 2022-10-06: 500 mg via INTRAMUSCULAR

## 2022-10-06 MED ORDER — METRONIDAZOLE 500 MG PO TABS: 500.0000 mg | ORAL_TABLET | Freq: Two times a day (BID) | ORAL | 0 refills | Status: DC

## 2022-10-06 MED ORDER — LIDOCAINE HCL (PF) 1 % IJ SOLN
INTRAMUSCULAR | Status: AC
  Filled 2022-10-06: qty 2

## 2022-10-06 MED ORDER — CEFTRIAXONE SODIUM 500 MG IJ SOLR
INTRAMUSCULAR | Status: AC
  Filled 2022-10-06: qty 500

## 2022-10-06 NOTE — ED Provider Notes (Signed)
[place]    CSN: [id number] Arrival date & time: 10/06/22  1138      History   Chief Complaint Chief Complaint  Patient presents with   Exposure to STD    Entered by patient   SEXUALLY TRANSMITTED DISEASE    HPI Colleen Lewis is a 32 y.o. female.   Patient presents to urgent care for evaluation of vaginal discharge and urinary discomfort (she attributes this to vaginal irritation) that started 3 days ago.  A recent sexual partner notified her that they tested positive for gonorrhea and trichomonas.  No other recent known exposure to STD.  She denies vaginal itching, vaginal rash, and oral lesions/rash.  She has been sexually in the past with both female and female partners.  No recent antibiotic or steroid use.  No nausea, vomiting, urinary frequency, urinary urgency, or gross hematuria.  No flank pain, abdominal pain, diarrhea, constipation, or fever/chills.  No antibiotic allergies.   Exposure to STD    Past Medical History:  Diagnosis Date   Asthma     There are no problems to display for this patient.   Past Surgical History:  Procedure Laterality Date   CHOLECYSTECTOMY      OB History   No obstetric history on file.      Home Medications    Prior to Admission medications   Medication Sig Start Date End Date Taking? Authorizing Provider  metroNIDAZOLE (FLAGYL) 500 MG tablet Take 1 tablet (500 mg total) by mouth 2 (two) times daily. 10/06/22  Yes Talbot Grumbling, FNP  albuterol (VENTOLIN HFA) 108 (90 Base) MCG/ACT inhaler Inhale 1-2 puffs into the lungs every 6 (six) hours as needed for wheezing or shortness of breath. 12/11/21   Talbot Grumbling, FNP  benzonatate (TESSALON PERLES) 100 MG capsule Take 1 capsule (100 mg total) by mouth 3 (three) times daily as needed for cough. FOR DISRUPTIVE DAY TIME COUGH Patient not taking: Reported on 01/14/2021 03/02/20   Kinnie Feil, PA-C  doxycycline (VIBRAMYCIN) 100 MG capsule Take 1 capsule  (100 mg total) by mouth 2 (two) times daily. Patient not taking: Reported on 01/14/2021 03/02/20   Kinnie Feil, PA-C  fluticasone [facility]) 50 MCG/ACT nasal spray Place 1 spray into both nostrils daily. 12/11/21   Talbot Grumbling, FNP  montelukast (SINGULAIR) 10 MG tablet Take 1 tablet (10 mg total) by mouth at bedtime. 12/11/21   Talbot Grumbling, FNP  predniSONE (DELTASONE) 20 MG tablet Take 2 tablets (40 mg total) by mouth daily. 03/12/22   Vanessa Kick, MD  triamcinolone cream (KENALOG) 0.1 % Apply 1 application topically 2 (two) times daily. 01/14/21   Jaynee Eagles, PA-C    Family History Family History  Problem Relation Age of Onset   Diabetes Mother    Diabetes Father     Social History Social History   Tobacco Use   Smoking status: Never   Smokeless tobacco: Never  Vaping Use   Vaping Use: Every day  Substance Use Topics   Alcohol use: Yes   Drug use: Yes    Types: Marijuana     Allergies   Benadryl [diphenhydramine]   Review of Systems Review of Systems Per HPI  Physical Exam Triage Vital Signs ED Triage Vitals  Enc Vitals Group     BP 10/06/22 1156 (!) 150/100     Pulse Rate 10/06/22 1156 82     Resp 10/06/22 1156 18     Temp 10/06/22 1156 99.6 F (  37.6 C)     Temp Source 10/06/22 1156 Oral     SpO2 10/06/22 1156 97 %     Weight --      Height --      Head Circumference --      Peak Flow --      Pain Score 10/06/22 1154 0     Pain Loc --      Pain Edu? --      Excl. in [place]? --    No data found.  Updated Vital Signs BP (!) 150/100 (BP Location: Right Arm)   Pulse 82   Temp 99.6 F (37.6 C) (Oral)   Resp 18   LMP 09/28/2022 (Exact Date)   SpO2 97%   Visual Acuity Right Eye Distance:   Left Eye Distance:   Bilateral Distance:    Right Eye Near:   Left Eye Near:    Bilateral Near:     Physical Exam Vitals and nursing note reviewed.  Constitutional:      Appearance: She is not ill-appearing or toxic-appearing.  HENT:      Head: Normocephalic and atraumatic.     Right Ear: Hearing and external ear normal.     Left Ear: Hearing and external ear normal.     Nose: Nose normal.     Mouth/Throat:     Lips: Pink.  Eyes:     General: Lids are normal. Vision grossly intact. Gaze aligned appropriately.     Extraocular Movements: Extraocular movements intact.     Conjunctiva/sclera: Conjunctivae normal.  Pulmonary:     Effort: Pulmonary effort is normal.  Genitourinary:    Comments: Deferred. Musculoskeletal:     Cervical back: Neck supple.  Skin:    General: Skin is warm and dry.     Capillary Refill: Capillary refill takes less than 2 seconds.     Findings: No rash.  Neurological:     General: No focal deficit present.     Mental Status: She is alert and oriented to person, place, and time. Mental status is at baseline.     Cranial Nerves: No dysarthria or facial asymmetry.  Psychiatric:        Mood and Affect: Mood normal.        Speech: Speech normal.        Behavior: Behavior normal.        Thought Content: Thought content normal.        Judgment: Judgment normal.      UC Treatments / Results  Labs (all labs ordered are listed, but only abnormal results are displayed) Labs Reviewed  HIV ANTIBODY (ROUTINE TESTING W REFLEX)  RPR  CERVICOVAGINAL ANCILLARY ONLY    EKG   Radiology No results found.  Procedures Procedures (including critical care time)  Medications Ordered in UC Medications  cefTRIAXone (ROCEPHIN) injection 500 mg (has no administration in time range)    Initial Impression / Assessment and Plan / UC Course  I have reviewed the triage vital signs and the nursing notes.  Pertinent labs & imaging results that were available during my care of the patient were reviewed by me and considered in my medical decision making (see chart for details).   1.  STD exposure, vaginal discharge STI labs pending, patient would like HIV and/or syphilis testing today. Will go ahead and  start treatment for gonorrhea and trichomonas based on symptoms and recent exposure. Flagyl sent to pharmacy to be taken as prescribed.  500 mg IM ceftriaxone given  in clinic.  Will treat for all other positive results once STI labs result. Patient to abstain from sexual intercourse for 7 days while undergoing treatment. Education provided regarding safe sexual practices and patient encouraged to use protection to prevent spread of STIs.   Discussed physical exam and available lab work findings in clinic with patient.  Counseled patient regarding appropriate use of medications and potential side effects for all medications recommended or prescribed today. Discussed red flag signs and symptoms of worsening condition,when to call the PCP office, return to urgent care, and when to seek higher level of care in the emergency department. Patient verbalizes understanding and agreement with plan. All questions answered. Patient discharged in stable condition.    Final Clinical Impressions(s) / UC Diagnoses   Final diagnoses:  STD exposure  Vaginal discharge     Discharge Instructions      You were tested today for STDs and the results are still pending; you have been given treatment for possible infections presumptively anyway due to your symptoms. You will receive a phone call in approximately 3 days if the results are positive. You should follow up with your primary care provider for further STI testing.  Avoid sexual intercourse for 7 days. Advise your sexual partner(s) to be evaluated, tested and treated. This includes all sexual partners within the past 60 days or your last sexual partner if last contact was greater than 60 days.  To minimize the risk of reinfection, you should abstain from sexual intercourse until your sexual partners have been tested and treated.   Consistent condom use is important in preventing the spread of sexually transmitted infections.  We will treat for any other  positive results when your labs come back. If you do not hear from [place], this means that your STI testing was negative or there is no change to your treatment plan. You will also receive these results via [place].   Return if you experience fevers 100.4 or greater, worsening or uncontrolled pain, rashes, sores, vomiting, or for any other concerning symptoms.     ED Prescriptions     Medication Sig Dispense Auth. Provider   metroNIDAZOLE (FLAGYL) 500 MG tablet Take 1 tablet (500 mg total) by mouth 2 (two) times daily. 14 tablet Talbot Grumbling, FNP      PDMP not reviewed this encounter.   Talbot Grumbling,  10/06/22 1225

## 2022-10-06 NOTE — Discharge Instructions (Addendum)
You were tested today for STDs and the results are still pending; you have been given treatment for possible infections presumptively anyway due to your symptoms. You will receive a phone call in approximately 3 days if the results are positive. You should follow up with your primary care provider for further STI testing.  Avoid sexual intercourse for 7 days. Advise your sexual partner(s) to be evaluated, tested and treated. This includes all sexual partners within the past 60 days or your last sexual partner if last contact was greater than 60 days.  To minimize the risk of reinfection, you should abstain from sexual intercourse until your sexual partners have been tested and treated.   Consistent condom use is important in preventing the spread of sexually transmitted infections.  We will treat for any other positive results when your labs come back. If you do not hear from [place], this means that your STI testing was negative or there is no change to your treatment plan. You will also receive these results via [place].   Return if you experience fevers 100.4 or greater, worsening or uncontrolled pain, rashes, sores, vomiting, or for any other concerning symptoms.

## 2022-10-06 NOTE — ED Triage Notes (Signed)
Pt states she was exposed to gonorrhea and trich and she would like tested. She states she is having some vaginal itching and burning X 3 days.

## 2022-10-07 LAB — CERVICOVAGINAL ANCILLARY ONLY
Bacterial Vaginitis (gardnerella): POSITIVE — AB
Candida Glabrata: NEGATIVE
Candida Vaginitis: NEGATIVE
Chlamydia: NEGATIVE
Comment: NEGATIVE
Comment: NEGATIVE
Comment: NEGATIVE
Comment: NEGATIVE
Comment: NEGATIVE
Comment: NORMAL
Neisseria Gonorrhea: NEGATIVE
Trichomonas: POSITIVE — AB

## 2022-10-07 LAB — RPR: RPR Ser Ql: NONREACTIVE

## 2022-12-16 ENCOUNTER — Emergency Department (HOSPITAL_BASED_OUTPATIENT_CLINIC_OR_DEPARTMENT_OTHER)
Admission: EM | Admit: 2022-12-16 | Discharge: 2022-12-16 | Disposition: A | Discharge status: Home / Self Care | Payer: Medicaid - Out of State | Attending: Emergency Medicine | Admitting: Emergency Medicine

## 2022-12-16 ENCOUNTER — Encounter (HOSPITAL_BASED_OUTPATIENT_CLINIC_OR_DEPARTMENT_OTHER): Payer: Self-pay | Admitting: Emergency Medicine

## 2022-12-16 ENCOUNTER — Other Ambulatory Visit: Payer: Self-pay

## 2022-12-16 DIAGNOSIS — Z1152 Encounter for screening for COVID-19: Secondary | ICD-10-CM | POA: Diagnosis not present

## 2022-12-16 DIAGNOSIS — J020 Streptococcal pharyngitis: Secondary | ICD-10-CM | POA: Diagnosis not present

## 2022-12-16 DIAGNOSIS — J029 Acute pharyngitis, unspecified: Secondary | ICD-10-CM | POA: Diagnosis present

## 2022-12-16 LAB — GROUP A STREP BY PCR: Group A Strep by PCR: DETECTED — AB

## 2022-12-16 LAB — RESP PANEL BY RT-PCR (RSV, FLU A&B, COVID)  RVPGX2
Influenza A by PCR: NEGATIVE
Influenza B by PCR: NEGATIVE
Resp Syncytial Virus by PCR: NEGATIVE
SARS Coronavirus 2 by RT PCR: NEGATIVE

## 2022-12-16 MED ORDER — PENICILLIN G BENZATHINE 1200000 UNIT/2ML IM SUSY
1.2000 10*6.[IU] | PREFILLED_SYRINGE | Freq: Once | INTRAMUSCULAR | Status: AC
  Administered 2022-12-16: 1.2 10*6.[IU] via INTRAMUSCULAR
  Filled 2022-12-16: qty 2

## 2022-12-16 MED ORDER — DEXAMETHASONE 4 MG PO TABS
10.0000 mg | ORAL_TABLET | Freq: Once | ORAL | Status: AC
  Administered 2022-12-16: 10 mg via ORAL
  Filled 2022-12-16: qty 3

## 2022-12-16 NOTE — Discharge Instructions (Signed)
Drink plenty of fluids.  Use lozenges, throat sprays, warm salt water gargles as needed.  Take ibuprofen and/or acetaminophen as needed for fever or pain.

## 2022-12-16 NOTE — ED Triage Notes (Signed)
Throat pain x 2 weeks, resolved and returned Sunday. Pain associated with nasal congestions, chills, dry cough, and emesis x1. Denies nausea and abdominal at this time.

## 2022-12-16 NOTE — ED Provider Notes (Signed)
[place] EMERGENCY DEPARTMENT AT [facility] Provider Note   CSN: [id number] Arrival date & time: 12/16/22  0020     History  Chief Complaint  Patient presents with   Sore Throat    Colleen Lewis is a 33 y.o. female.  The history is provided by the patient.  Sore Throat  She has history of asthma and comes in with a 2-week history of sore throat with intermittent nasal congestion.  She denies fever but has had some intermittent chills.  There has been minimal cough which is nonproductive.  She had 1 episode of emesis, but nausea is not been a prominent part of her complaints.  She has complained of some generalized bodyaches.  She denies any sick contacts.   Home Medications Prior to Admission medications   Medication Sig Start Date End Date Taking? Authorizing Provider  albuterol (VENTOLIN HFA) 108 (90 Base) MCG/ACT inhaler Inhale 1-2 puffs into the lungs every 6 (six) hours as needed for wheezing or shortness of breath. 12/11/21   Carlisle Beers, FNP  fluticasone (FLONASE) 50 MCG/ACT nasal spray Place 1 spray into both nostrils daily. 12/11/21   Carlisle Beers, FNP  montelukast (SINGULAIR) 10 MG tablet Take 1 tablet (10 mg total) by mouth at bedtime. 12/11/21   Carlisle Beers, FNP  triamcinolone cream (KENALOG) 0.1 % Apply 1 application topically 2 (two) times daily. 01/14/21   Wallis Bamberg, PA-C      Allergies    Benadryl [diphenhydramine]    Review of Systems   Review of Systems  All other systems reviewed and are negative.   Physical Exam Updated Vital Signs BP (!) 140/96   Pulse 93   Temp 98 F (36.7 C) (Oral)   Resp (!) 22   SpO2 100%  Physical Exam Vitals and nursing note reviewed.   32 year old female, resting comfortably and in no acute distress. Vital signs are significant for slightly elevated blood pressure and borderline elevated respiratory rate. Oxygen saturation is 100%, which is normal. Head is normocephalic and atraumatic.  PERRLA, EOMI. Oropharynx is moderately erythematous with moderate tonsillar hypertrophy bilaterally.  No tonsillar exudate is seen.  There is no pooling of secretions.  Phonation is normal. Neck is nontender and supple without adenopathy. Lungs are clear without rales, wheezes, or rhonchi. Chest is nontender. Heart has regular rate and rhythm without murmur. Abdomen is soft, flat, nontender. Extremities have no cyanosis or edema, full range of motion is present. Skin is warm and dry without rash. Neurologic: Mental status is normal, cranial nerves are intact, moves all extremities equally.  ED Results / Procedures / Treatments   Labs (all labs ordered are listed, but only abnormal results are displayed) Labs Reviewed  GROUP A STREP BY PCR - Abnormal; Notable for the following components:      Result Value   Group A Strep by PCR DETECTED (*)    All other components within normal limits  RESP PANEL BY RT-PCR (RSV, FLU A&B, COVID)  [id number]   Procedures Procedures    Medications Ordered in ED Medications  penicillin g benzathine (BICILLIN LA) 1200000 UNIT/2ML injection 1.2 Million Units (has no administration in time range)  dexamethasone (DECADRON) tablet 10 mg (has no administration in time range)    ED Course/ Medical Decision Making/ A&P                             Medical Decision Making Risk  Prescription drug management.   Pharyngitis which is either streptococcal or viral.  I have reviewed and interpreted her laboratory tests and my interpretation is positive PCR for strep consistent with streptococcal pharyngitis, negative PCR for COVID-19 and influenza and RSV.  I offered patient the option of oral versus parenteral antibiotics and she has elected for parenteral antibiotics.  I have ordered a dose of benzathine penicillin potassium as well as a dose of oral dexamethasone.        Final Clinical Impression(s) / ED Diagnoses Final diagnoses:  Strep pharyngitis     Rx / DC Orders ED Discharge Orders     None         Dione Booze, MD 12/16/22 [phone]

## 2023-01-21 ENCOUNTER — Other Ambulatory Visit: Payer: Self-pay

## 2023-01-21 ENCOUNTER — Emergency Department (HOSPITAL_BASED_OUTPATIENT_CLINIC_OR_DEPARTMENT_OTHER)
Admission: EM | Admit: 2023-01-21 | Discharge: 2023-01-21 | Disposition: A | Discharge status: Home / Self Care | Payer: Medicaid - Out of State | Attending: Emergency Medicine | Admitting: Emergency Medicine

## 2023-01-21 ENCOUNTER — Encounter (HOSPITAL_BASED_OUTPATIENT_CLINIC_OR_DEPARTMENT_OTHER): Payer: Self-pay | Admitting: Emergency Medicine

## 2023-01-21 DIAGNOSIS — J029 Acute pharyngitis, unspecified: Secondary | ICD-10-CM | POA: Insufficient documentation

## 2023-01-21 DIAGNOSIS — H9201 Otalgia, right ear: Secondary | ICD-10-CM | POA: Diagnosis not present

## 2023-01-21 LAB — GROUP A STREP BY PCR: Group A Strep by PCR: NOT DETECTED

## 2023-01-21 MED ORDER — AMOXICILLIN 500 MG PO CAPS: 500.0000 mg | ORAL_CAPSULE | Freq: Two times a day (BID) | ORAL | 0 refills | Status: AC

## 2023-01-21 NOTE — ED Triage Notes (Signed)
Pt arrives pov, steady gait with c/o sore throat and post nasal drainage x 2 days. Excedrin at 0400 today with some relief. Also endorses RT ear pain

## 2023-01-21 NOTE — Discharge Instructions (Addendum)
I would recommend follow-up with an ENT doctor, who is a throat specialist, to discuss your enlarged tonsils.  You may benefit from a discussion about a potential tonsillectomy or surgery.  Enlarged tonsils can lead to sleep apnea, which can cause a lot of medical problems down the line, including higher blood pressure, as well as interruptions of your sleep cycle.  You likely have a viral syndrome today.  Most viruses resolve in 3 to 5 days.  You can take ibuprofen and Tylenol as needed for pain.  Continue drinking plenty of water.  If you do begin having fevers with worsening sore throat, you can begin taking the antibiotic prescribed.

## 2023-01-21 NOTE — ED Provider Notes (Signed)
[place] EMERGENCY DEPARTMENT AT [facility] Provider Note   CSN: [id number] Arrival date & time: 01/21/23  4098     History  Chief Complaint  Patient presents with   Sore Throat    Colleen Lewis is a 32 y.o. female presenting to the ED with 2 days of sore throat, right ear pain.  Patient denies cough, headaches, fevers or chills.  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  Patient was treated for strep pharyngitis 1 month ago with a positive strep test in the ED, with penicillin.  She said her symptoms rapidly improved after that.  She denies recurring strep infections.  She does report snoring at night and reports "enlarged tonsils".  She does not see ENT  HPI     Home Medications Prior to Admission medications   Medication Sig Start Date End Date Taking? Authorizing Provider  amoxicillin (AMOXIL) 500 MG capsule Take 1 capsule (500 mg total) by mouth 2 (two) times daily for 10 days. 01/21/23 01/31/23 Yes Castle Lamons, Kermit Balo, MD  albuterol (VENTOLIN HFA) 108 (90 Base) MCG/ACT inhaler Inhale 1-2 puffs into the lungs every 6 (six) hours as needed for wheezing or shortness of breath. 12/11/21   Carlisle Beers, FNP  fluticasone (FLONASE) 50 MCG/ACT nasal spray Place 1 spray into both nostrils daily. 12/11/21   Carlisle Beers, FNP  montelukast (SINGULAIR) 10 MG tablet Take 1 tablet (10 mg total) by mouth at bedtime. 12/11/21   Carlisle Beers, FNP  triamcinolone cream (KENALOG) 0.1 % Apply 1 application topically 2 (two) times daily. 01/14/21   Wallis Bamberg, PA-C      Allergies    Benadryl [diphenhydramine]    Review of Systems   Review of Systems  Physical Exam Updated Vital Signs BP (!) 134/102 (BP Location: Left Leg)   Pulse 98   Temp 98.4 F (36.9 C) (Oral)   Resp 18   Ht 5\' 5"  (1.651 m)   Wt 99.8 kg   LMP 12/26/2022   SpO2 99%   BMI 36.61 kg/m  Physical Exam Constitutional:      General: She is not in acute  distress. HENT:     Head: Normocephalic and atraumatic.     Comments: Symmetrical tonsillar enlargement, without exudates, uvula is midline, voice is clear Bilateral TMs unremarkable Eyes:     Conjunctiva/sclera: Conjunctivae normal.     Pupils: Pupils are equal, round, and reactive to light.  Cardiovascular:     Rate and Rhythm: Normal rate and regular rhythm.  Pulmonary:     Effort: Pulmonary effort is normal. No respiratory distress.  Abdominal:     General: There is no distension.     Tenderness: There is no abdominal tenderness.  Skin:    General: Skin is warm and dry.  Neurological:     General: No focal deficit present.     Mental Status: She is alert. Mental status is at baseline.  Psychiatric:        Mood and Affect: Mood normal.        Behavior: Behavior normal.     ED Results / Procedures / Treatments   Labs (all labs ordered are listed, but only abnormal results are displayed) Labs Reviewed  GROUP A STREP BY PCR    EKG None  Radiology No results found.  Procedures Procedures    Medications Ordered in ED Medications - No data to display  ED Course/ Medical Decision Making/ A&P  Medical Decision Making Risk Prescription drug management.   Patient is here with sore throat.  Suspect this may be a viral syndrome versus recurring strep pharyngitis versus other.  I doubt Ludwig's angina or peritonsillar abscess.  No emergent indication for CT imaging of the neck at this time.  Strep test is negative  Do not see evidence of acute otitis media.  I suspect this is related to the middle ear dysfunction from tonsillar swelling.  I do think given the prominence of the patient's tonsils on exam, her report of likely sleep apnea at night, recurring tonsillar infections, it is reasonable to refer to ENT.  She may benefit from a discussion about tonsillectomy but this will need to be done at the outpatient setting.  Watch & wait  prescription if developing further symptoms of potential strep pharyngitis - given recent hx and absence of cough.        Final Clinical Impression(s) / ED Diagnoses Final diagnoses:  Sore throat    Rx / DC Orders ED Discharge Orders          Ordered    amoxicillin (AMOXIL) 500 MG capsule  2 times daily        01/21/23 0952              Terald Sleeper, MD 01/21/23 1046

## 2023-04-20 ENCOUNTER — Ambulatory Visit (HOSPITAL_COMMUNITY)
Admission: RE | Admit: 2023-04-20 | Discharge: 2023-04-20 | Disposition: A | Discharge status: Home / Self Care | Payer: Self-pay | Source: Ambulatory Visit | Attending: Internal Medicine | Admitting: Internal Medicine

## 2023-04-20 ENCOUNTER — Encounter (HOSPITAL_COMMUNITY): Payer: Self-pay

## 2023-04-20 VITALS — BP 144/105 | HR 105 | Temp 98.3°F | Resp 18

## 2023-04-20 DIAGNOSIS — Z113 Encounter for screening for infections with a predominantly sexual mode of transmission: Secondary | ICD-10-CM | POA: Insufficient documentation

## 2023-04-20 DIAGNOSIS — R030 Elevated blood-pressure reading, without diagnosis of hypertension: Secondary | ICD-10-CM | POA: Insufficient documentation

## 2023-04-20 LAB — HIV ANTIBODY (ROUTINE TESTING W REFLEX): HIV Screen 4th Generation wRfx: NONREACTIVE

## 2023-04-20 NOTE — ED Provider Notes (Signed)
[facility]    CSN: [id number] Arrival date & time: 04/20/23  1650      History   Chief Complaint Chief Complaint  Patient presents with   SEXUALLY TRANSMITTED DISEASE    HPI Colleen Lewis is a 32 y.o. female who presents stating her ex-female partner who lives with her still has tested positive for Chlamydia and advised her to be tested. Pt denies knowing how her ex got it.  She denies abdominal pain or fever.     Past Medical History:  Diagnosis Date   Asthma     There are no problems to display for this patient.   Past Surgical History:  Procedure Laterality Date   CHOLECYSTECTOMY      OB History   No obstetric history on file.      Home Medications    Prior to Admission medications   Medication Sig Start Date End Date Taking? Authorizing Provider  albuterol (VENTOLIN HFA) 108 (90 Base) MCG/ACT inhaler Inhale 1-2 puffs into the lungs every 6 (six) hours as needed for wheezing or shortness of breath. 12/11/21   Carlisle Beers, FNP  fluticasone (FLONASE) 50 MCG/ACT nasal spray Place 1 spray into both nostrils daily. 12/11/21   Carlisle Beers, FNP  montelukast (SINGULAIR) 10 MG tablet Take 1 tablet (10 mg total) by mouth at bedtime. 12/11/21   Carlisle Beers, FNP  triamcinolone cream (KENALOG) 0.1 % Apply 1 application topically 2 (two) times daily. 01/14/21   Wallis Bamberg, PA-C    Family History Family History  Problem Relation Age of Onset   Diabetes Mother    Diabetes Father     Social History Social History   Tobacco Use   Smoking status: Every Day    Types: E-cigarettes   Smokeless tobacco: Never  Vaping Use   Vaping status: Every Day  Substance Use Topics   Alcohol use: Yes    Comment: ocassional   Drug use: Yes    Types: Marijuana     Allergies   Benadryl [diphenhydramine]   Review of Systems Review of Systems  Cardiovascular:        Has elevated BP from coming to the Dr, but when she checks it at home is  usually 100's/80's    As noted in HPI Physical Exam Triage Vital Signs ED Triage Vitals [04/20/23 1711]  Encounter Vitals Group     BP (!) 153/112     Systolic BP Percentile      Diastolic BP Percentile      Pulse Rate (!) 103     Resp 18     Temp 98.3 F (36.8 C)     Temp Source Oral     SpO2 98 %     Weight      Height      Head Circumference      Peak Flow      Pain Score 0     Pain Loc      Pain Education      Exclude from Growth Chart    No data found.  Updated Vital Signs BP (!) 153/112 (BP Location: Left Arm)   Pulse (!) 103   Temp 98.3 F (36.8 C) (Oral)   Resp 18   LMP 04/20/2023   SpO2 98%   Visual Acuity Right Eye Distance:   Left Eye Distance:   Bilateral Distance:    Right Eye Near:   Left Eye Near:    Bilateral Near:  Physical Exam Vitals and nursing note reviewed.  Constitutional:      General: She is not in acute distress.    Appearance: She is normal weight.  HENT:     Right Ear: External ear normal.     Left Ear: External ear normal.  Eyes:     Conjunctiva/sclera: Conjunctivae normal.  Pulmonary:     Effort: Pulmonary effort is normal.  Musculoskeletal:        General: Normal range of motion.     Cervical back: Neck supple.  Neurological:     Mental Status: She is alert and oriented to person, place, and time.  Psychiatric:        Mood and Affect: Mood normal.        Behavior: Behavior normal.        Thought Content: Thought content normal.        Judgment: Judgment normal.      UC Treatments / Results  Labs (all labs ordered are listed, but only abnormal results are displayed) Labs Reviewed  HIV ANTIBODY (ROUTINE TESTING W REFLEX)  CERVICOVAGINAL ANCILLARY ONLY    EKG   Radiology No results found.  Procedures Procedures (including critical care time)  Medications Ordered in UC Medications - No data to display  Initial Impression / Assessment and Plan / UC Course  I have reviewed the triage vital signs  and the nursing notes.  STI screen  See instructions Final Clinical Impressions(s) / UC Diagnoses   Final diagnoses:  Screen for STD (sexually transmitted disease)  White coat syndrome without hypertension     Discharge Instructions      We will call you if your STD test comes back positive    ED Prescriptions   None    PDMP not reviewed this encounter.   Garey Ham, [place] 04/20/23 1727

## 2023-04-20 NOTE — Discharge Instructions (Signed)
We will call you if your STD test comes back positive.

## 2023-04-20 NOTE — ED Triage Notes (Signed)
Pt states her partner advised her she tested positive for chlamydia. She denies any sx but would like to be tested and treated.

## 2023-04-21 LAB — CERVICOVAGINAL ANCILLARY ONLY
Bacterial Vaginitis (gardnerella): NEGATIVE
Candida Glabrata: NEGATIVE
Candida Vaginitis: NEGATIVE
Chlamydia: NEGATIVE
Comment: NEGATIVE
Comment: NEGATIVE
Comment: NEGATIVE
Comment: NEGATIVE
Comment: NEGATIVE
Comment: NORMAL
Neisseria Gonorrhea: NEGATIVE
Trichomonas: NEGATIVE

## 2024-01-23 ENCOUNTER — Other Ambulatory Visit: Payer: Self-pay

## 2024-01-23 ENCOUNTER — Ambulatory Visit
Admission: EM | Admit: 2024-01-23 | Discharge: 2024-01-23 | Disposition: A | Discharge status: Home / Self Care | Attending: Emergency Medicine | Admitting: Emergency Medicine

## 2024-01-23 DIAGNOSIS — N898 Other specified noninflammatory disorders of vagina: Secondary | ICD-10-CM | POA: Diagnosis present

## 2024-01-23 DIAGNOSIS — Z113 Encounter for screening for infections with a predominantly sexual mode of transmission: Secondary | ICD-10-CM | POA: Diagnosis present

## 2024-01-23 DIAGNOSIS — Z3202 Encounter for pregnancy test, result negative: Secondary | ICD-10-CM | POA: Diagnosis present

## 2024-01-23 LAB — POCT URINE PREGNANCY: Preg Test, Ur: NEGATIVE

## 2024-01-23 MED ORDER — FLUCONAZOLE 150 MG PO TABS: 150.0000 mg | ORAL_TABLET | Freq: Once | ORAL | 0 refills | Status: AC | PRN

## 2024-01-23 NOTE — ED Triage Notes (Signed)
 Pt c/o vaginal itching, vaginal painx1wk Menstrual a week late

## 2024-01-23 NOTE — Discharge Instructions (Signed)
 We will call you if anything on your swab returns positive. You can also see these results on MyChart. Please abstain from sexual intercourse until your results return.  In the meantime I am treating you for yeast

## 2024-01-23 NOTE — ED Provider Notes (Signed)
 [facility]    CSN: [id number] Arrival date & time: 01/23/24  1028      History   Chief Complaint Chief Complaint  Patient presents with   Vaginal Itching    HPI Colleen Lewis is a 33 y.o. female.  1 week history of vaginal itching and irritation She denies discharge or odor Not having urinary symptoms.  No abdominal pain.  No fever Sexually active, no known exposure to STD but would like testing  Also reports her menstrual cycle is about a week late.  LMP 5/10.  She has been stressed more recently  Past Medical History:  Diagnosis Date   Asthma     There are no active problems to display for this patient.   Past Surgical History:  Procedure Laterality Date   CHOLECYSTECTOMY      OB History   No obstetric history on file.      Home Medications    Prior to Admission medications   Medication Sig Start Date End Date Taking? Authorizing Provider  fluconazole (DIFLUCAN) 150 MG tablet Take 1 tablet (150 mg total) by mouth once as needed for up to 2 doses (take one pill on day 1, and the second pill 3 days later). 01/23/24  Yes Sheena Simonis, Ivette Marks, PA-C  albuterol  (VENTOLIN  HFA) 108 (90 Base) MCG/ACT inhaler Inhale 1-2 puffs into the lungs every 6 (six) hours as needed for wheezing or shortness of breath. 12/11/21   Starlene Eaton, FNP    Family History Family History  Problem Relation Age of Onset   Diabetes Mother    Diabetes Father     Social History Social History   Tobacco Use   Smoking status: Every Day    Types: E-cigarettes   Smokeless tobacco: Never  Vaping Use   Vaping status: Every Day  Substance Use Topics   Alcohol use: Yes    Comment: ocassional   Drug use: Yes    Types: Marijuana     Allergies   Benadryl [diphenhydramine]   Review of Systems Review of Systems  As per HPI  Physical Exam Triage Vital Signs ED Triage Vitals  Encounter Vitals Group     BP 01/23/24 1050 (!) 158/107     Girls Systolic BP  Percentile --      Girls Diastolic BP Percentile --      Boys Systolic BP Percentile --      Boys Diastolic BP Percentile --      Pulse Rate 01/23/24 1050 81     Resp 01/23/24 1050 16     Temp 01/23/24 1050 98.8 F (37.1 C)     Temp Source 01/23/24 1050 Oral     SpO2 01/23/24 1050 98 %     Weight --      Height --      Head Circumference --      Peak Flow --      Pain Score 01/23/24 1048 2     Pain Loc --      Pain Education --      Exclude from Growth Chart --    No data found.  Updated Vital Signs BP (!) 158/107   Pulse 81   Temp 98.8 F (37.1 C) (Oral)   Resp 16   LMP 12/18/2023   SpO2 98%    Physical Exam Vitals and nursing note reviewed.  Constitutional:      General: She is not in acute distress.    Appearance: Normal appearance.  HENT:  Mouth/Throat:     Pharynx: Oropharynx is clear.   Cardiovascular:     Rate and Rhythm: Normal rate and regular rhythm.     Heart sounds: Normal heart sounds.  Pulmonary:     Effort: Pulmonary effort is normal.     Breath sounds: Normal breath sounds.  Abdominal:     Palpations: Abdomen is soft.     Tenderness: There is no abdominal tenderness. There is no right CVA tenderness, left CVA tenderness or guarding.   Neurological:     Mental Status: She is alert and oriented to person, place, and time.      UC Treatments / Results  Labs (all labs ordered are listed, but only abnormal results are displayed) Labs Reviewed  POCT URINE PREGNANCY  CERVICOVAGINAL ANCILLARY ONLY    EKG   Radiology No results found.  Procedures Procedures (including critical care time)  Medications Ordered in UC Medications - No data to display  Initial Impression / Assessment and Plan / UC Course  I have reviewed the triage vital signs and the nursing notes.  Pertinent labs & imaging results that were available during my care of the patient were reviewed by me and considered in my medical decision making (see chart for  details).  UPT negative Cytology swab pending.  With vaginal itching, treat for yeast vaginitis with fluconazole 2 dose.  Treat other positive result as indicated.  Advise reasons to return to clinic.  Patient agrees to plan, all questions answered  Final Clinical Impressions(s) / UC Diagnoses   Final diagnoses:  Vaginal itching  Screen for STD (sexually transmitted disease)  Negative pregnancy test     Discharge Instructions      We will call you if anything on your swab returns positive. You can also see these results on MyChart. Please abstain from sexual intercourse until your results return.  In the meantime I am treating you for yeast    ED Prescriptions     Medication Sig Dispense Auth. Provider   fluconazole (DIFLUCAN) 150 MG tablet Take 1 tablet (150 mg total) by mouth once as needed for up to 2 doses (take one pill on day 1, and the second pill 3 days later). 2 tablet Finleigh Cheong, Ivette Marks, PA-C      PDMP not reviewed this encounter.   Kynslee Baham, Ivette Marks, [place] 01/23/24 1147

## 2024-01-24 ENCOUNTER — Ambulatory Visit (HOSPITAL_COMMUNITY): Payer: Self-pay

## 2024-01-24 LAB — CERVICOVAGINAL ANCILLARY ONLY
Bacterial Vaginitis (gardnerella): POSITIVE — AB
Candida Glabrata: NEGATIVE
Candida Vaginitis: NEGATIVE
Chlamydia: NEGATIVE
Comment: NEGATIVE
Comment: NEGATIVE
Comment: NEGATIVE
Comment: NEGATIVE
Comment: NEGATIVE
Comment: NORMAL
Neisseria Gonorrhea: NEGATIVE
Trichomonas: NEGATIVE

## 2024-01-24 MED ORDER — METRONIDAZOLE 500 MG PO TABS: 500.0000 mg | ORAL_TABLET | Freq: Two times a day (BID) | ORAL | 0 refills | Status: AC

## 2024-07-04 ENCOUNTER — Ambulatory Visit
Admission: RE | Admit: 2024-07-04 | Discharge: 2024-07-04 | Disposition: A | Discharge status: Home / Self Care | Source: Ambulatory Visit | Attending: Internal Medicine | Admitting: Internal Medicine

## 2024-07-04 ENCOUNTER — Telehealth: Payer: Self-pay

## 2024-07-04 VITALS — BP 142/104 | HR 88 | Temp 99.0°F | Resp 18

## 2024-07-04 DIAGNOSIS — J029 Acute pharyngitis, unspecified: Secondary | ICD-10-CM | POA: Diagnosis not present

## 2024-07-04 DIAGNOSIS — J039 Acute tonsillitis, unspecified: Secondary | ICD-10-CM

## 2024-07-04 LAB — POCT RAPID STREP A (OFFICE): Rapid Strep A Screen: NEGATIVE

## 2024-07-04 MED ORDER — ALBUTEROL SULFATE HFA 108 (90 BASE) MCG/ACT IN AERS: 1.0000 | INHALATION_SPRAY | Freq: Four times a day (QID) | RESPIRATORY_TRACT | 1 refills | Status: AC | PRN

## 2024-07-04 MED ORDER — IBUPROFEN 600 MG PO TABS: 600.0000 mg | ORAL_TABLET | Freq: Four times a day (QID) | ORAL | 0 refills | Status: AC | PRN

## 2024-07-04 MED ORDER — AMOXICILLIN 875 MG PO TABS: 875.0000 mg | ORAL_TABLET | Freq: Two times a day (BID) | ORAL | 0 refills | Status: AC

## 2024-07-04 MED ORDER — AMOXICILLIN 875 MG PO TABS: 875.0000 mg | ORAL_TABLET | Freq: Two times a day (BID) | ORAL | 0 refills | Status: DC

## 2024-07-04 NOTE — ED Triage Notes (Signed)
 Pt present with c/o sore throat x 3 days. Pt states she has not taken anything for relief. States she wants an excuse note for work.

## 2024-07-04 NOTE — Discharge Instructions (Addendum)
 Strep testing done today is negative.  Symptoms and physical exam findings are consistent with an acute tonsillitis versus acute pharyngitis.  This does appear to be bacterial in nature.  We will treat with antibiotics by mouth.  We will treat with the following: Amoxicillin  875 mg twice daily for 7 days.  This is an antibiotic.  Take this with food.  Ibuprofen  600 mg every 6 hours as needed for pain Make sure to stay hydrated by drinking plenty of water. Return to urgent care or PCP if symptoms worsen or fail to resolve.

## 2024-07-04 NOTE — ED Provider Notes (Signed)
 [facility]    CSN: [id number] Arrival date & time: 07/04/24  1519      History   Chief Complaint Chief Complaint  Patient presents with   Sore Throat    Entered by patient    HPI Colleen Lewis is a 33 y.o. female.   33 year old female who presents urgent care with complaints of sore throat.  She reports this started about 3 days ago.  She relates that it is painful to swallow and it feels like there is a lot of swelling around the throat.  She denies any fevers, headache, chills, nausea, vomiting, congestion, cough.  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  She has not taken any over-the-counter medication for symptoms.   Sore Throat Pertinent negatives include no chest pain, no abdominal pain and no shortness of breath.    Past Medical History:  Diagnosis Date   Asthma     There are no active problems to display for this patient.   Past Surgical History:  Procedure Laterality Date   CHOLECYSTECTOMY      OB History   No obstetric history on file.      Home Medications    Prior to Admission medications   Medication Sig Start Date End Date Taking? Authorizing Provider  amoxicillin  (AMOXIL ) 875 MG tablet Take 1 tablet (875 mg total) by mouth 2 (two) times daily for 10 days. 07/04/24 07/14/24 Yes Veva Grimley A, PA-C  ibuprofen  (ADVIL ) 600 MG tablet Take 1 tablet (600 mg total) by mouth every 6 (six) hours as needed. 07/04/24  Yes Sanita Estrada A, PA-C  albuterol  (VENTOLIN  HFA) 108 (90 Base) MCG/ACT inhaler Inhale 1-2 puffs into the lungs every 6 (six) hours as needed for wheezing or shortness of breath. 12/11/21   Enedelia Dorna HERO, FNP  fluconazole  (DIFLUCAN ) 150 MG tablet Take 1 tablet (150 mg total) by mouth once as needed for up to 2 doses (take one pill on day 1, and the second pill 3 days later). 01/23/24   Rising, [place] PA-C    Family History Family History  Problem Relation Age of Onset   Diabetes Mother    Diabetes Father     Social  History Social History   Tobacco Use   Smoking status: Every Day    Types: E-cigarettes   Smokeless tobacco: Never  Vaping Use   Vaping status: Every Day  Substance Use Topics   Alcohol use: Yes    Comment: ocassional   Drug use: Yes    Types: Marijuana     Allergies   Benadryl [diphenhydramine]   Review of Systems Review of Systems  Constitutional:  Negative for chills and fever.  HENT:  Positive for sore throat and trouble swallowing. Negative for ear pain.   Eyes:  Negative for pain and visual disturbance.  Respiratory:  Negative for cough and shortness of breath.   Cardiovascular:  Negative for chest pain and palpitations.  Gastrointestinal:  Negative for abdominal pain and vomiting.  Genitourinary:  Negative for dysuria and hematuria.  Musculoskeletal:  Negative for arthralgias and back pain.  Skin:  Negative for color change and rash.  Neurological:  Negative for seizures and syncope.  All other systems reviewed and are negative.    Physical Exam Triage Vital Signs ED Triage Vitals  Encounter Vitals Group     BP 07/04/24 1554 (!) 142/104     Girls Systolic BP Percentile --      Girls Diastolic BP Percentile --  Boys Systolic BP Percentile --      Boys Diastolic BP Percentile --      Pulse Rate 07/04/24 1554 88     Resp 07/04/24 1554 18     Temp 07/04/24 1554 99 F (37.2 C)     Temp Source 07/04/24 1554 Oral     SpO2 07/04/24 1554 97 %     Weight --      Height --      Head Circumference --      Peak Flow --      Pain Score 07/04/24 1553 3     Pain Loc --      Pain Education --      Exclude from Growth Chart --    No data found.  Updated Vital Signs BP (!) 142/104   Pulse 88   Temp 99 F (37.2 C) (Oral)   Resp 18   LMP 06/27/2024 (Exact Date)   SpO2 97%   Visual Acuity Right Eye Distance:   Left Eye Distance:   Bilateral Distance:    Right Eye Near:   Left Eye Near:    Bilateral Near:     Physical Exam Vitals and nursing note  reviewed.  Constitutional:      General: She is not in acute distress.    Appearance: She is well-developed.  HENT:     Head: Normocephalic and atraumatic.     Mouth/Throat:     Mouth: Mucous membranes are moist.     Pharynx: Uvula midline. Pharyngeal swelling, oropharyngeal exudate and posterior oropharyngeal erythema present. No uvula swelling.     Tonsils: Tonsillar exudate present. 3+ on the right. 3+ on the left.  Eyes:     Conjunctiva/sclera: Conjunctivae normal.  Cardiovascular:     Rate and Rhythm: Normal rate and regular rhythm.     Heart sounds: No murmur heard. Pulmonary:     Effort: Pulmonary effort is normal. No respiratory distress.     Breath sounds: Normal breath sounds.  Abdominal:     Palpations: Abdomen is soft.     Tenderness: There is no abdominal tenderness.  Musculoskeletal:        General: No swelling.     Cervical back: Neck supple.  Skin:    General: Skin is warm and dry.     Capillary Refill: Capillary refill takes less than 2 seconds.  Neurological:     Mental Status: She is alert.  Psychiatric:        Mood and Affect: Mood normal.      UC Treatments / Results  Labs (all labs ordered are listed, but only abnormal results are displayed) Labs Reviewed  POCT RAPID STREP A (OFFICE)    EKG   Radiology No results found.  Procedures Procedures (including critical care time)  Medications Ordered in UC Medications - No data to display  Initial Impression / Assessment and Plan / UC Course  I have reviewed the triage vital signs and the nursing notes.  Pertinent labs & imaging results that were available during my care of the patient were reviewed by me and considered in my medical decision making (see chart for details).     Sore throat - Plan: POCT rapid strep A, POCT rapid strep A  Acute tonsillitis, unspecified etiology   Strep testing done today is negative.  Symptoms and physical exam findings are consistent with an acute  tonsillitis versus acute pharyngitis.  This does appear to be bacterial in nature.  We will treat  with antibiotics by mouth.  We will treat with the following: Amoxicillin  875 mg twice daily for 7 days.  This is an antibiotic.  Take this with food.  Ibuprofen  600 mg every 6 hours as needed for pain Make sure to stay hydrated by drinking plenty of water. Return to urgent care or PCP if symptoms worsen or fail to resolve.    Final Clinical Impressions(s) / UC Diagnoses   Final diagnoses:  Sore throat  Acute tonsillitis, unspecified etiology     Discharge Instructions      Strep testing done today is negative.  Symptoms and physical exam findings are consistent with an acute tonsillitis versus acute pharyngitis.  This does appear to be bacterial in nature.  We will treat with antibiotics by mouth.  We will treat with the following: Amoxicillin  875 mg twice daily for 7 days.  This is an antibiotic.  Take this with food.  Ibuprofen  600 mg every 6 hours as needed for pain Make sure to stay hydrated by drinking plenty of water. Return to urgent care or PCP if symptoms worsen or fail to resolve.       ED Prescriptions     Medication Sig Dispense Auth. Provider   amoxicillin  (AMOXIL ) 875 MG tablet Take 1 tablet (875 mg total) by mouth 2 (two) times daily for 10 days. 20 tablet Sydnei Ohaver A, PA-C   ibuprofen  (ADVIL ) 600 MG tablet Take 1 tablet (600 mg total) by mouth every 6 (six) hours as needed. 30 tablet Teresa Almarie LABOR, [place]      PDMP not reviewed this encounter.   Teresa Almarie LABOR, [place] 07/04/24 1621

## 2024-09-13 ENCOUNTER — Ambulatory Visit
Admission: RE | Admit: 2024-09-13 | Discharge: 2024-09-13 | Disposition: A | Discharge status: Home / Self Care | Source: Ambulatory Visit

## 2024-09-13 VITALS — BP 147/106 | HR 107 | Temp 99.4°F | Resp 20

## 2024-09-13 DIAGNOSIS — R319 Hematuria, unspecified: Secondary | ICD-10-CM | POA: Diagnosis not present

## 2024-09-13 DIAGNOSIS — N939 Abnormal uterine and vaginal bleeding, unspecified: Secondary | ICD-10-CM

## 2024-09-13 LAB — POCT URINE DIPSTICK
Bilirubin, UA: NEGATIVE
Glucose, UA: NEGATIVE mg/dL
Ketones, POC UA: NEGATIVE mg/dL
Leukocytes, UA: NEGATIVE
Nitrite, UA: NEGATIVE
POC PROTEIN,UA: NEGATIVE
Spec Grav, UA: 1.01
Urobilinogen, UA: 0.2 U/dL
pH, UA: 5.5

## 2024-09-13 NOTE — ED Provider Notes (Signed)
 " [facility]  Note:  This document was prepared using Conservation officer, historic buildings and may include unintentional dictation errors.  MRN: [id number] DOB: 30-Dec-1990  Subjective:   Colleen Lewis is a 34 y.o. female presenting for 3 to 4-day history of vaginal bleeding.  LMP was 08/26/2024.  Patient is fairly regular.  Is sexually active with females only.  Does not use any form of protection.  No concern for pregnancy.  Denies fever, n/v, abdominal pain, pelvic pain, rashes, dysuria, urinary frequency, hematuria, vaginal discharge.    Current Outpatient Medications  Medication Instructions   albuterol  (VENTOLIN  HFA) 108 (90 Base) MCG/ACT inhaler 1-2 puffs, Inhalation, Every 6 hours PRN   fluconazole  (DIFLUCAN ) 150 mg, Oral, Once PRN   ibuprofen  (ADVIL ) 600 mg, Oral, Every 6 hours PRN    Allergies[1]  Past Medical History:  Diagnosis Date   Asthma      Past Surgical History:  Procedure Laterality Date   CHOLECYSTECTOMY      Family History  Problem Relation Age of Onset   Diabetes Mother    Diabetes Father     Social History   Occupational History   Not on file  Tobacco Use   Smoking status: Every Day    Types: E-cigarettes   Smokeless tobacco: Never  Vaping Use   Vaping status: Every Day  Substance and Sexual Activity   Alcohol use: Not Currently   Drug use: Yes    Types: Marijuana   Sexual activity: Yes    Birth control/protection: Other-see comments    Comment: female partners     ROS   Objective:   Vitals: BP (!) 147/106 (BP Location: Right Arm)   Pulse (!) 107   Temp 99.4 F (37.4 C) (Oral)   Resp 20   LMP 08/26/2024   SpO2 100%   Physical Exam Constitutional:      General: She is not in acute distress.    Appearance: Normal appearance. She is well-developed. She is not ill-appearing, toxic-appearing or diaphoretic.  HENT:     Head: Normocephalic and atraumatic.     Nose: Nose normal.     Mouth/Throat:      Mouth: Mucous membranes are moist.  Eyes:     General: No scleral icterus.       Right eye: No discharge.        Left eye: No discharge.     Extraocular Movements: Extraocular movements intact.  Cardiovascular:     Rate and Rhythm: Normal rate.  Pulmonary:     Effort: Pulmonary effort is normal.  Skin:    General: Skin is warm and dry.  Neurological:     General: No focal deficit present.     Mental Status: She is alert and oriented to person, place, and time.  Psychiatric:        Mood and Affect: Mood normal.        Behavior: Behavior normal.     Results for orders placed or performed during the hospital encounter of 09/13/24 (from the past 24 hours)  POCT URINE DIPSTICK     Status: Abnormal   Collection Time: 09/13/24  6:52 PM  Result Value Ref Range   Color, UA yellow yellow   Clarity, UA hazy (A) clear   Glucose, UA negative negative mg/dL   Bilirubin, UA negative negative   Ketones, POC UA negative negative mg/dL   Spec Grav, UA 8.989 8.989 - 1.025   Blood, UA moderate (A) negative  pH, UA 5.5 5.0 - 8.0   POC PROTEIN,UA negative negative, trace   Urobilinogen, UA 0.2 0.2 or 1.0 E.U./dL   Nitrite, UA Negative Negative   Leukocytes, UA Negative Negative    Assessment and Plan :   PDMP not reviewed this encounter.  1. Vaginal bleeding   2. Hematuria, unspecified type     Will pursue vaginal cytology and urine culture.  Patient and I agreed to defer treatment and based off of results.    [1]  Allergies Allergen Reactions   Benadryl [Diphenhydramine] Hives     Christopher Savannah, [place] 09/13/24 1857  "

## 2024-09-13 NOTE — Discharge Instructions (Signed)
 Will update you on your test results tomorrow.  Make sure you hydrate very well with plain water and a quantity of 80 ounces of water a day.  Please limit drinks that are considered urinary irritants such as fruit juices, soda, sweet tea, coffee, artifical sweetened drinks, energy drinks, alcohol.  These can worsen your symptoms but also be the source of them.  I will let you know about your vaginal swab and urine culture results through MyChart to see if we need to prescribe or change your antibiotics based off of those results.

## 2024-09-13 NOTE — ED Triage Notes (Signed)
 Pt c/o vaginal bleeding x 3-4 days-LMP ~1/17-NAD-steady gait

## 2024-09-14 ENCOUNTER — Ambulatory Visit: Payer: Self-pay | Admitting: Urgent Care

## 2024-09-14 ENCOUNTER — Telehealth: Payer: Self-pay

## 2024-09-14 LAB — CERVICOVAGINAL ANCILLARY ONLY
Bacterial Vaginitis (gardnerella): POSITIVE — AB
Chlamydia: NEGATIVE
Comment: NEGATIVE
Comment: NEGATIVE
Comment: NEGATIVE
Comment: NORMAL
Neisseria Gonorrhea: NEGATIVE
Trichomonas: NEGATIVE

## 2024-09-14 MED ORDER — METRONIDAZOLE 500 MG PO TABS: 500.0000 mg | ORAL_TABLET | Freq: Two times a day (BID) | ORAL | 0 refills | Status: AC

## 2024-09-14 NOTE — Telephone Encounter (Signed)
 Patient does not need to present to the clinic for urine recollection. Will manage for BV infection given her positive test result. Prescription sent to her pharmacy on file for metronidazole .

## 2024-09-14 NOTE — Telephone Encounter (Signed)
 Microbiology called to state they did not receive urine culture sample. Mani PA-C reviewed pt's chart-states pt does not need to return for urine culture sample and he will send in med for +BV-pt notified of the above

## 2024-09-14 NOTE — Telephone Encounter (Signed)
 Pt will return for a urine sample to sent to the lab for culture.
